# Patient Record
Sex: Female | Born: 1965 | Race: Black or African American | Hispanic: No | Marital: Married | Smoking: Former smoker
Health system: Southern US, Community
[De-identification: ages and names within clinical notes are randomized; demographics above are authoritative.]

## PROBLEM LIST (undated history)

## (undated) HISTORY — PX: HERNIA REPAIR: SHX51

## (undated) HISTORY — PX: TUBAL LIGATION: SHX77

## (undated) HISTORY — PX: ROTATOR CUFF REPAIR: SHX139

## (undated) HISTORY — PX: LAPAROSCOPIC GASTRIC SLEEVE RESECTION: SHX5895

---

## 2003-10-06 HISTORY — PX: LAPAROSCOPIC GASTRIC BANDING: SHX1100

## 2011-12-23 ENCOUNTER — Emergency Department (HOSPITAL_BASED_OUTPATIENT_CLINIC_OR_DEPARTMENT_OTHER)
Admission: EM | Admit: 2011-12-23 | Discharge: 2011-12-23 | Disposition: A | Discharge status: Home / Self Care | Payer: No Typology Code available for payment source | Attending: Emergency Medicine | Admitting: Emergency Medicine

## 2011-12-23 ENCOUNTER — Encounter (HOSPITAL_BASED_OUTPATIENT_CLINIC_OR_DEPARTMENT_OTHER): Payer: Self-pay | Admitting: *Deleted

## 2011-12-23 ENCOUNTER — Emergency Department (INDEPENDENT_AMBULATORY_CARE_PROVIDER_SITE_OTHER): Payer: No Typology Code available for payment source

## 2011-12-23 DIAGNOSIS — J45909 Unspecified asthma, uncomplicated: Secondary | ICD-10-CM

## 2011-12-23 DIAGNOSIS — R062 Wheezing: Secondary | ICD-10-CM

## 2011-12-23 DIAGNOSIS — R0602 Shortness of breath: Secondary | ICD-10-CM | POA: Insufficient documentation

## 2011-12-23 DIAGNOSIS — R059 Cough, unspecified: Secondary | ICD-10-CM

## 2011-12-23 DIAGNOSIS — R0989 Other specified symptoms and signs involving the circulatory and respiratory systems: Secondary | ICD-10-CM

## 2011-12-23 DIAGNOSIS — R05 Cough: Secondary | ICD-10-CM

## 2011-12-23 MED ORDER — ALBUTEROL SULFATE (5 MG/ML) 0.5% IN NEBU
INHALATION_SOLUTION | RESPIRATORY_TRACT | Status: AC
  Administered 2011-12-23: 5 mg via RESPIRATORY_TRACT
  Filled 2011-12-23: qty 1

## 2011-12-23 MED ORDER — IPRATROPIUM BROMIDE 0.02 % IN SOLN
RESPIRATORY_TRACT | Status: AC
  Filled 2011-12-23: qty 2.5

## 2011-12-23 MED ORDER — PREDNISONE 20 MG PO TABS
ORAL_TABLET | ORAL | Status: AC
  Administered 2011-12-23: 20 mg via ORAL
  Filled 2011-12-23: qty 1

## 2011-12-23 MED ORDER — IPRATROPIUM BROMIDE 0.02 % IN SOLN
0.5000 mg | Freq: Once | RESPIRATORY_TRACT | Status: AC
  Administered 2011-12-23: 0.5 mg via RESPIRATORY_TRACT

## 2011-12-23 MED ORDER — PREDNISONE 20 MG PO TABS
20.0000 mg | ORAL_TABLET | Freq: Every day | ORAL | Status: DC
  Administered 2011-12-23: 20 mg via ORAL

## 2011-12-23 MED ORDER — ALBUTEROL SULFATE (5 MG/ML) 0.5% IN NEBU
5.0000 mg | INHALATION_SOLUTION | Freq: Once | RESPIRATORY_TRACT | Status: AC
  Administered 2011-12-23: 5 mg via RESPIRATORY_TRACT

## 2011-12-23 MED ORDER — ALBUTEROL SULFATE (5 MG/ML) 0.5% IN NEBU
INHALATION_SOLUTION | RESPIRATORY_TRACT | Status: AC
  Filled 2011-12-23: qty 1

## 2011-12-23 MED ORDER — ALBUTEROL SULFATE (5 MG/ML) 0.5% IN NEBU
2.5000 mg | INHALATION_SOLUTION | Freq: Once | RESPIRATORY_TRACT | Status: AC
  Administered 2011-12-23: 5 mg via RESPIRATORY_TRACT

## 2011-12-23 MED ORDER — FLUTICASONE-SALMETEROL 250-50 MCG/DOSE IN AEPB: 1.0000 | INHALATION_SPRAY | Freq: Two times a day (BID) | RESPIRATORY_TRACT | Status: DC

## 2011-12-23 MED ORDER — PREDNISONE 20 MG PO TABS: 40.0000 mg | ORAL_TABLET | Freq: Every day | ORAL | Status: AC

## 2011-12-23 NOTE — Discharge Instructions (Signed)
Asthma, Adult  Asthma is a disease of the lungs and can make it hard to breathe. Asthma cannot be cured, but medicine can help control it. Asthma may be started (triggered) by:   Pollen.   Dust.   Animal skin flakes (dander).   Molds.   Foods.   Respiratory infections (colds, flu).   Smoke.   Exercise.   Stress.   Other things that cause allergic reactions or allergies (allergens).  HOME CARE    Talk to your doctor about how to manage your attacks at home. This may include:   Using a tool called a peak flow meter.   Having medicine ready to stop the attack.   Take all medicine as told by your doctor.   Wash bed sheets and blankets every week in hot water and put them in the dryer.   Drink enough fluids to keep your pee (urine) clear or pale yellow.   Always be ready to get emergency help. Write down the phone number for your doctor. Keep it where you can easily find it.   Talk about exercise routines with your doctor.   If animal dander is causing your asthma, you may need to find a new home for your pet(s).  GET HELP RIGHT AWAY IF:    You have muscle aches.   You cough more.   You have chest pain.   You have thick spit (sputum) that changes to yellow, green, gray, or bloody.   Medicine does not stop your wheezing.   You have problems breathing.   You have a fever.   Your medicine causes:   A rash.   Itching.   Puffiness (swelling).   Breathing problems.  MAKE SURE YOU:    Understand these instructions.   Will watch your condition.   Will get help right away if you are not doing well or get worse.  Document Released: 03/09/2008 Document Revised: 09/10/2011 Document Reviewed: 08/01/2008  [employer] Patient Information 2012 [employer].

## 2011-12-23 NOTE — ED Notes (Signed)
Pt in with c/o SOB with inspiratory and expiratory wheezing off and on since . Pt currently out of inhalers and asthma meds. respiratory at triage for assessment.

## 2011-12-23 NOTE — ED Provider Notes (Addendum)
History     CSN: [id number]  Arrival date & time 12/23/11  1608   First MD Initiated Contact with Patient 12/23/11 1625      Chief Complaint  Patient presents with  . Asthma    (Consider location/radiation/quality/duration/timing/severity/associated sxs/prior treatment) Patient is a 46 y.o. female presenting with asthma. The history is provided by the patient.  Asthma This is a new problem. Episode onset: 2 weeks. The problem occurs constantly. The problem has been gradually worsening. Associated symptoms include shortness of breath. Pertinent negatives include no chest pain and no abdominal pain. The symptoms are aggravated by walking. Relieved by: albuterol nebs. Treatments tried: nebs. The treatment provided mild relief.    No past medical history on file.  No past surgical history on file.  No family history on file.  History  Substance Use Topics  . Smoking status: Not on file  . Smokeless tobacco: Not on file  . Alcohol Use: Not on file    OB History    No data available      Review of Systems  Constitutional: Negative for fever.  HENT: Positive for congestion and rhinorrhea.   Respiratory: Positive for cough and shortness of breath.   Cardiovascular: Negative for chest pain.  Gastrointestinal: Negative for abdominal pain.  All other systems reviewed and are negative.    Allergies  Vicodin  Home Medications  No current outpatient prescriptions on file.  BP 137/107  Pulse 97  Temp(Src) 98.2 F (36.8 C) (Oral)  Resp 20  Ht 5\' 6"  (1.676 m)  Wt 340 lb (154.223 kg)  BMI 54.88 kg/m2  SpO2 96%  Physical Exam  Constitutional: She is oriented to person, place, and time. She appears well-developed and well-nourished. No distress.  HENT:  Head: Normocephalic and atraumatic.  Right Ear: Tympanic membrane, external ear and ear canal normal.  Left Ear: Tympanic membrane, external ear and ear canal normal.  Nose: Mucosal edema present.  Mouth/Throat:  Oropharynx is clear and moist.  Eyes: Conjunctivae and EOM are normal. Pupils are equal, round, and reactive to light. Right eye exhibits no discharge.  Neck: Normal range of motion. Neck supple.  Cardiovascular: Normal rate, regular rhythm, normal heart sounds and intact distal pulses.   No murmur heard. Pulmonary/Chest: Tachypnea noted. No respiratory distress. She has no decreased breath sounds. She has wheezes. She has no rhonchi. She has no rales.  Abdominal: Soft. There is no tenderness.  Musculoskeletal: Normal range of motion. She exhibits no edema and no tenderness.  Neurological: She is alert and oriented to person, place, and time.  Skin: Skin is warm and dry. No rash noted.  Psychiatric: She has a normal mood and affect.    ED Course  Procedures (including critical care time)  Labs Reviewed - No data to display Dg Chest 2 View  12/23/2011  *RADIOLOGY REPORT*  Clinical Data: Cough, congestion, asthma.  CHEST - 2 VIEW  Comparison: None.  Findings: Trachea is midline.  Heart size normal.  Lungs are somewhat low in volume but clear.  No pleural fluid.  IMPRESSION: No acute findings.  Original Report Authenticated By: Reyes Ivan, M.D.     1. Asthma       MDM   Pt with typical asthma exacerbation  Symptoms out of advair for 3 weeks.  Recent cold which she states is improving but persistent productive cough and denies other complaints.  Wheezing on exam.  will give steroids, albuterol/atrovent and recheck.  CXR pending.   5:07  PM CXR wnl.  Wheezing improved and will d/c home.      Gwyneth Sprout, MD 12/23/11 1707  Gwyneth Sprout, MD 12/23/11 9811  Gwyneth Sprout, MD 12/23/11 1721

## 2012-03-13 ENCOUNTER — Encounter (HOSPITAL_BASED_OUTPATIENT_CLINIC_OR_DEPARTMENT_OTHER): Payer: Self-pay | Admitting: *Deleted

## 2012-03-13 ENCOUNTER — Emergency Department (HOSPITAL_BASED_OUTPATIENT_CLINIC_OR_DEPARTMENT_OTHER)
Admission: EM | Admit: 2012-03-13 | Discharge: 2012-03-14 | Disposition: A | Discharge status: Home / Self Care | Payer: No Typology Code available for payment source | Attending: Emergency Medicine | Admitting: Emergency Medicine

## 2012-03-13 DIAGNOSIS — F410 Panic disorder [episodic paroxysmal anxiety] without agoraphobia: Secondary | ICD-10-CM | POA: Insufficient documentation

## 2012-03-13 DIAGNOSIS — R000 Tachycardia, unspecified: Secondary | ICD-10-CM | POA: Insufficient documentation

## 2012-03-13 MED ORDER — SODIUM CHLORIDE 0.9 % IV BOLUS (SEPSIS)
1000.0000 mL | Freq: Once | INTRAVENOUS | Status: AC
  Administered 2012-03-14: 1000 mL via INTRAVENOUS

## 2012-03-13 NOTE — ED Notes (Signed)
Pt reports that she has been under a lot of stress and has not eaten well lately
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
## 2012-03-13 NOTE — ED Notes (Signed)
Patient moved from EMS stretcher to ED bed and medics noticed patient was sitting in her own blood. Patient thinks all may be from menstral bleeding. Patient stated no chance of her being pregnant.

## 2012-03-14 LAB — COMPREHENSIVE METABOLIC PANEL
ALT: 21 U/L (ref 0–35)
AST: 51 U/L — ABNORMAL HIGH (ref 0–37)
Alkaline Phosphatase: 90 U/L (ref 39–117)
CO2: 24 mEq/L (ref 19–32)
Chloride: 101 mEq/L (ref 96–112)
Creatinine, Ser: 1 mg/dL (ref 0.50–1.10)
GFR calc non Af Amer: 67 mL/min — ABNORMAL LOW (ref >90)
Total Bilirubin: 0.5 mg/dL (ref 0.3–1.2)

## 2012-03-14 LAB — DIFFERENTIAL
Basophils Absolute: 0 10*3/uL (ref 0.0–0.1)
Basophils Relative: 0 % (ref 0–1)
Eosinophils Absolute: 0.4 10*3/uL (ref 0.0–0.7)
Neutrophils Relative %: 76 % (ref 43–77)

## 2012-03-14 LAB — CBC
MCH: 25.6 pg — ABNORMAL LOW (ref 26.0–34.0)
MCHC: 36.4 g/dL — ABNORMAL HIGH (ref 30.0–36.0)
Platelets: 390 10*3/uL (ref 150–400)
RBC: 4.45 MIL/uL (ref 3.87–5.11)
RDW: 16.7 % — ABNORMAL HIGH (ref 11.5–15.5)

## 2012-03-14 LAB — PREGNANCY, URINE: Preg Test, Ur: NEGATIVE

## 2012-03-14 LAB — RAPID URINE DRUG SCREEN, HOSP PERFORMED: Opiates: NOT DETECTED

## 2012-03-14 LAB — ETHANOL: Alcohol, Ethyl (B): <11 mg/dL (ref 0–11)

## 2012-03-14 MED ORDER — DIAZEPAM 5 MG PO TABS
10.0000 mg | ORAL_TABLET | Freq: Once | ORAL | Status: AC
  Administered 2012-03-14: 10 mg via ORAL
  Filled 2012-03-14: qty 2

## 2012-03-14 MED ORDER — DIAZEPAM 5 MG PO TABS: 5.0000 mg | ORAL_TABLET | Freq: Three times a day (TID) | ORAL | Status: AC | PRN

## 2012-03-14 NOTE — ED Provider Notes (Addendum)
History     CSN: [id number]  Arrival date & time 03/13/12  2257   First MD Initiated Contact with Patient 03/14/12 0101      Chief Complaint  Patient presents with  . Panic Attack    (Consider location/radiation/quality/duration/timing/severity/associated sxs/prior treatment) HPI This is a 46 year old black female with a remote history of panic attacks. She states she has not been sleeping well for about a week. She spent the last 3 days throwing a graduation party for her daughter. This party included about 100 guests. She states that she has only gotten about 2 hours of sleep a night. She has been up since early yesterday morning. She states that she had a panic attack late yesterday evening. It was characterized by hyperventilation, paresthesias of the fingertips, weakness and near-syncope. She did not completely lose consciousness. EMS was called and brought her here. She was noted to be tachycardic prior to arrival and on arrival. Her symptoms have improved since arrival. She denies chest pain at any time. She has not been vomiting. She is on her period and having heavy menstrual bleeding. She has also been having muscle spasms, particularly in her legs which he describes as "charley horses".  Past Medical History  Diagnosis Date  . Asthma     Past Surgical History  Procedure Date  . Rotator cuff repair     R arm  . Hernia repair   . Tubal ligation     No family history on file.  History  Substance Use Topics  . Smoking status: Former Games developer  . Smokeless tobacco: Never Used  . Alcohol Use: 0.6 oz/week    1 Cans of beer per week    OB History    Grav Para Term Preterm Abortions TAB SAB Ect Mult Living                  Review of Systems  All other systems reviewed and are negative.    Allergies  Vicodin  Home Medications   Current Outpatient Rx  Name Route Sig Dispense Refill  . ALBUTEROL SULFATE (5 MG/ML) 0.5% IN NEBU Nebulization Take 2.5 mg by  nebulization every 6 (six) hours as needed.    Marland Kitchen FLUTICASONE-SALMETEROL 250-50 MCG/DOSE IN AEPB Inhalation Inhale 1 puff into the lungs 2 (two) times daily. 60 each 2  . IBUPROFEN 200 MG PO TABS Oral Take 400 mg by mouth every 6 (six) hours as needed. Patient used this medication for her cramps.    Marland Kitchen MONTELUKAST SODIUM 10 MG PO TABS Oral Take 10 mg by mouth at bedtime.    Marland Kitchen BENADRYL ALLERGY PO Oral Take 2 capsules by mouth daily as needed. Patient used this medication for allergy symptoms.      BP 156/97  Pulse 102  Temp(Src) 98.9 F (37.2 C) (Oral)  Resp 22  SpO2 100%  LMP 03/13/2012  Physical Exam General: Well-developed, obese female in no acute distress; appearance consistent with age of record HENT: normocephalic, atraumatic Eyes: pupils equal round and reactive to light; extraocular muscles intact Neck: supple Heart: regular rate and rhythm; tachycardic Lungs: clear to auscultation bilaterally Abdomen: soft; nondistended; obese Extremities: No deformity; full range of motion; pulses normal Neurologic: Awake, alert and oriented; motor function intact in all extremities and symmetric; no facial droop Skin: Warm and dry Psychiatric: Normal mood and affect    ED Course  Procedures (including critical care time)     MDM   Nursing notes and vitals signs, including pulse  oximetry, reviewed.  Summary of this visit's results, reviewed by myself:  Labs:  Results for orders placed during the hospital encounter of 03/13/12  COMPREHENSIVE METABOLIC PANEL      Component Value Range   Sodium 136  135 - 145 (mEq/L)   Potassium 3.2 (*) 3.5 - 5.1 (mEq/L)   Chloride 101  96 - 112 (mEq/L)   CO2 24  19 - 32 (mEq/L)   Glucose, Bld 114 (*) 70 - 99 (mg/dL)   BUN 11  6 - 23 (mg/dL)   Creatinine, Ser 4.09  0.50 - 1.10 (mg/dL)   Calcium 9.4  8.4 - 81.1 (mg/dL)   Total Protein 8.1  6.0 - 8.3 (g/dL)   Albumin 4.0  3.5 - 5.2 (g/dL)   AST 51 (*) 0 - 37 (U/L)   ALT 21  0 - 35 (U/L)    Alkaline Phosphatase 90  39 - 117 (U/L)   Total Bilirubin 0.5  0.3 - 1.2 (mg/dL)   GFR calc non Af Amer 67 (*) >90 (mL/min)   GFR calc Af Amer 78 (*) >90 (mL/min)  CBC      Component Value Range   WBC 12.9 (*) 4.0 - 10.5 (K/uL)   RBC 4.45  3.87 - 5.11 (MIL/uL)   Hemoglobin 11.4 (*) 12.0 - 15.0 (g/dL)   HCT 91.4 (*) 78.2 - 46.0 (%)   MCV 70.3 (*) 78.0 - 100.0 (fL)   MCH 25.6 (*) 26.0 - 34.0 (pg)   MCHC 36.4 (*) 30.0 - 36.0 (g/dL)   RDW 95.6 (*) 21.3 - 15.5 (%)   Platelets 390  150 - 400 (K/uL)  DIFFERENTIAL      Component Value Range   Neutrophils Relative 76  43 - 77 (%)   Neutro Abs 9.8 (*) 1.7 - 7.7 (K/uL)   Lymphocytes Relative 13  12 - 46 (%)   Lymphs Abs 1.7  0.7 - 4.0 (K/uL)   Monocytes Relative 8  3 - 12 (%)   Monocytes Absolute 1.0  0.1 - 1.0 (K/uL)   Eosinophils Relative 3  0 - 5 (%)   Eosinophils Absolute 0.4  0.0 - 0.7 (K/uL)   Basophils Relative 0  0 - 1 (%)   Basophils Absolute 0.0  0.0 - 0.1 (K/uL)  ETHANOL      Component Value Range   Alcohol, Ethyl (B) <11  0 - 11 (mg/dL)  URINE RAPID DRUG SCREEN (HOSP PERFORMED)      Component Value Range   Opiates NONE DETECTED  NONE DETECTED    Cocaine NONE DETECTED  NONE DETECTED    Benzodiazepines NONE DETECTED  NONE DETECTED    Amphetamines NONE DETECTED  NONE DETECTED    Tetrahydrocannabinol NONE DETECTED  NONE DETECTED    Barbiturates NONE DETECTED  NONE DETECTED   PREGNANCY, URINE      Component Value Range   Preg Test, Ur NEGATIVE  NEGATIVE     Imaging Studies: No results found.  EKG Interpretation:  Date & Time: 03/14/2012 11:32 PM  Rate: 98  Rhythm: normal sinus rhythm  QRS Axis: normal  Intervals: normal  ST/T Wave abnormalities: normal  Conduction Disutrbances:none  Narrative Interpretation:   Old EKG Reviewed: none available  2:16 AM Patient feels better after 1 L of normal saline bolus.           Hanley Seamen, MD 03/14/12 0214  Hanley Seamen, MD 03/14/12 0865

## 2012-03-14 NOTE — Discharge Instructions (Signed)

## 2012-09-04 HISTORY — PX: LAPAROSCOPIC REPAIR AND REMOVAL OF GASTRIC BAND: SHX5919

## 2013-01-17 ENCOUNTER — Emergency Department (HOSPITAL_BASED_OUTPATIENT_CLINIC_OR_DEPARTMENT_OTHER)
Admission: EM | Admit: 2013-01-17 | Discharge: 2013-01-17 | Disposition: A | Discharge status: Home / Self Care | Payer: No Typology Code available for payment source | Attending: Emergency Medicine | Admitting: Emergency Medicine

## 2013-01-17 ENCOUNTER — Encounter (HOSPITAL_BASED_OUTPATIENT_CLINIC_OR_DEPARTMENT_OTHER): Payer: Self-pay | Admitting: *Deleted

## 2013-01-17 ENCOUNTER — Emergency Department (HOSPITAL_BASED_OUTPATIENT_CLINIC_OR_DEPARTMENT_OTHER): Payer: No Typology Code available for payment source

## 2013-01-17 DIAGNOSIS — R0789 Other chest pain: Secondary | ICD-10-CM | POA: Insufficient documentation

## 2013-01-17 DIAGNOSIS — R05 Cough: Secondary | ICD-10-CM | POA: Insufficient documentation

## 2013-01-17 DIAGNOSIS — J45901 Unspecified asthma with (acute) exacerbation: Secondary | ICD-10-CM | POA: Insufficient documentation

## 2013-01-17 DIAGNOSIS — Z87891 Personal history of nicotine dependence: Secondary | ICD-10-CM | POA: Insufficient documentation

## 2013-01-17 DIAGNOSIS — R509 Fever, unspecified: Secondary | ICD-10-CM | POA: Insufficient documentation

## 2013-01-17 DIAGNOSIS — R059 Cough, unspecified: Secondary | ICD-10-CM | POA: Insufficient documentation

## 2013-01-17 DIAGNOSIS — Z79899 Other long term (current) drug therapy: Secondary | ICD-10-CM | POA: Insufficient documentation

## 2013-01-17 MED ORDER — ALBUTEROL SULFATE (5 MG/ML) 0.5% IN NEBU
5.0000 mg | INHALATION_SOLUTION | Freq: Once | RESPIRATORY_TRACT | Status: AC
  Administered 2013-01-17: 5 mg via RESPIRATORY_TRACT

## 2013-01-17 MED ORDER — PREDNISONE 50 MG PO TABS
60.0000 mg | ORAL_TABLET | Freq: Once | ORAL | Status: AC
  Administered 2013-01-17: 60 mg via ORAL
  Filled 2013-01-17: qty 1

## 2013-01-17 MED ORDER — ALBUTEROL SULFATE (5 MG/ML) 0.5% IN NEBU
INHALATION_SOLUTION | RESPIRATORY_TRACT | Status: AC
  Administered 2013-01-17: 5 mg via RESPIRATORY_TRACT
  Filled 2013-01-17: qty 1

## 2013-01-17 MED ORDER — IPRATROPIUM BROMIDE 0.02 % IN SOLN
0.5000 mg | Freq: Once | RESPIRATORY_TRACT | Status: AC
  Administered 2013-01-17: 0.5 mg via RESPIRATORY_TRACT
  Filled 2013-01-17: qty 2.5

## 2013-01-17 MED ORDER — ALBUTEROL (5 MG/ML) CONTINUOUS INHALATION SOLN
10.0000 mg/h | INHALATION_SOLUTION | Freq: Once | RESPIRATORY_TRACT | Status: DC
  Filled 2013-01-17: qty 0.5
  Filled 2013-01-17: qty 20

## 2013-01-17 MED ORDER — BENZONATATE 100 MG PO CAPS: 100.0000 mg | ORAL_CAPSULE | Freq: Three times a day (TID) | ORAL | Status: DC | PRN

## 2013-01-17 MED ORDER — ALBUTEROL (5 MG/ML) CONTINUOUS INHALATION SOLN
15.0000 mg/h | INHALATION_SOLUTION | Freq: Once | RESPIRATORY_TRACT | Status: AC
  Administered 2013-01-17: 15 mg/h via RESPIRATORY_TRACT
  Filled 2013-01-17: qty 20

## 2013-01-17 MED ORDER — ALBUTEROL SULFATE (5 MG/ML) 0.5% IN NEBU
INHALATION_SOLUTION | RESPIRATORY_TRACT | Status: AC
  Filled 2013-01-17: qty 1

## 2013-01-17 MED ORDER — PREDNISONE 50 MG PO TABS: ORAL_TABLET | ORAL | Status: DC

## 2013-01-17 NOTE — ED Notes (Signed)
Cold symptoms with upper respiratory and cough for one week.  This morning at 0830 she developed a sudden onset of sob.  Uncontrollable cough with audible wheezes noted.

## 2013-01-17 NOTE — ED Provider Notes (Signed)
I saw and evaluated the patient, reviewed the resident's note and I agree with the findings and plan.  Hx asthma with one week of difficulty breathing, coughing, chest tightness with coughing.  ED visit one year ago, no problems since then.  States compliance with meds but conflicting history given to different staff members. Diffuse expiratory wheezing with decreased air  Improved after steroids and continuous neb.  Glynn Octave, MD 01/17/13 [phone]

## 2013-01-17 NOTE — ED Notes (Signed)
Respiratory assessment after 1 hour NEB: PEFR 380 102% predicted FEV1 1.4 45% predicted Good effort from patient

## 2013-01-17 NOTE — ED Notes (Signed)
Respiratory PEFR assessment: PEFR 315 l/m 85% of predicted. FEV1 1.4l/sec 45% of predicted. Good effort from patient.

## 2013-01-17 NOTE — ED Notes (Signed)
Patient ambulated to and from restroom HR 128-138, RR 18-24, SpO2 94-98% on room air.

## 2013-01-17 NOTE — ED Provider Notes (Signed)
History     CSN: [id number]  Arrival date & time 01/17/13  0918   First MD Initiated Contact with Patient 01/17/13 [phone]      Chief Complaint  Patient presents with  . Shortness of Breath    (Consider location/radiation/quality/duration/timing/severity/associated sxs/prior treatment) Patient is a 47 y.o. female presenting with shortness of breath.  Shortness of Breath Associated symptoms: fever   Associated symptoms: no abdominal pain and no rash     Patient presents with wheezing and uncontrollable coughing.  She has a history of Asthma, says she has been "nursing a cold" for a week and using her albuterol inhaler/nebs around the clock.  This morning she took an albuterol treatment about 6 am and it did not help, she had uncontrollable coughing, so she decided to come to the ER.  She says she had a fever on Friday (4 days ago), but not since then.  She coughed up some phlegm this morning. She says she has some chest tightness that is typical for her asthma exacerbations.   She is not taking any other medications and has no other medical problems.   Past Medical History  Diagnosis Date  . Asthma     Past Surgical History  Procedure Laterality Date  . Rotator cuff repair      R arm  . Hernia repair    . Tubal ligation    . Laparoscopic gastric banding  2005  . Laparoscopic repair and removal of gasric band  09/2012    No family history on file.  History  Substance Use Topics  . Smoking status: Former Games developer  . Smokeless tobacco: Never Used  . Alcohol Use: 0.6 oz/week    1 Cans of beer per week   Review of Systems  Constitutional: Positive for fever.  Eyes: Negative for visual disturbance.  Respiratory: Positive for shortness of breath.   Cardiovascular: Negative for leg swelling.  Gastrointestinal: Negative for abdominal pain.  Musculoskeletal: Negative for myalgias.  Skin: Negative for rash.  Neurological: Negative for dizziness.  Hematological: Negative for  adenopathy.    Allergies  Vicodin  Home Medications   Current Outpatient Rx  Name  Route  Sig  Dispense  Refill  . albuterol (PROVENTIL) (5 MG/ML) 0.5% nebulizer solution   Nebulization   Take 2.5 mg by nebulization every 6 (six) hours as needed.         . DiphenhydrAMINE HCl (BENADRYL ALLERGY PO)   Oral   Take 2 capsules by mouth daily as needed. Patient used this medication for allergy symptoms.         . Fluticasone-Salmeterol (ADVAIR DISKUS) 250-50 MCG/DOSE AEPB   Inhalation   Inhale 1 puff into the lungs 2 (two) times daily.   60 each   2   . ibuprofen (ADVIL,MOTRIN) 200 MG tablet   Oral   Take 400 mg by mouth every 6 (six) hours as needed. Patient used this medication for her cramps.         . montelukast (SINGULAIR) 10 MG tablet   Oral   Take 10 mg by mouth at bedtime.           BP 154/99  Pulse 136  Resp 28  SpO2 93%  LMP 01/14/2013  Physical Exam  Vitals reviewed. Constitutional: She appears well-developed and well-nourished. She appears distressed.  HENT:  Head: Normocephalic.  Right Ear: External ear normal.  Left Ear: External ear normal.  Mouth/Throat: Oropharynx is clear and moist.  Eyes: EOM are normal.  Cardiovascular: Regular rhythm.  Tachycardia present.   Pulmonary/Chest:  Tachypnea, difficulty speaking in complete sentences, poor air movement with diffuse wheezes  Musculoskeletal: She exhibits no edema.  No calf tenderness.  Lymphadenopathy:    She has no cervical adenopathy.  Skin: Skin is warm. No rash noted.    ED Course  Procedures (including critical care time)  Labs Reviewed - No data to display Dg Chest 2 View  01/17/2013  *RADIOLOGY REPORT*  Clinical Data: Shortness of breath with cough and wheezing. History of asthma.  CHEST - 2 VIEW  Comparison: 12/23/2011.  Findings: The heart size and mediastinal contours are stable. There are stable low lung volumes with mild bibasilar atelectasis. No edema, confluent airspace  opacity or significant pleural effusion is seen.  IMPRESSION: Stable examination.  No acute cardiopulmonary process.   Original Report Authenticated By: Carey Bullocks, M.D.    CRITICAL CARE Performed by: Ardyth Gal   Total critical care time: 1 hour  Critical care time was exclusive of separately billable procedures and treating other patients.  Critical care was necessary to treat or prevent imminent or life-threatening deterioration.  Critical care was time spent personally by me on the following activities: development of treatment plan with patient and/or surrogate as well as nursing, discussions with consultants, evaluation of patient's response to treatment, examination of patient, obtaining history from patient or surrogate, ordering and performing treatments and interventions, ordering and review of laboratory studies, ordering and review of radiographic studies, pulse oximetry and re-evaluation of patient's condition.   1. Acute asthma exacerbation       MDM  After continuous albuterol nebulizer x 1 hour and PO Prednisone patient with significant symptomatic improvement in breathing as well as decreased wheezing on clinical exam. She was able to ambulate without desaturations.  Discharged home with course of prednisone and tessalon for cough Advised she establish a primary care provider in the area for Asthma management.         Ardyth Gal, MD 01/17/13 [phone]

## 2015-01-08 ENCOUNTER — Emergency Department (HOSPITAL_BASED_OUTPATIENT_CLINIC_OR_DEPARTMENT_OTHER): Payer: No Typology Code available for payment source

## 2015-01-08 ENCOUNTER — Encounter (HOSPITAL_BASED_OUTPATIENT_CLINIC_OR_DEPARTMENT_OTHER): Payer: Self-pay

## 2015-01-08 ENCOUNTER — Emergency Department (HOSPITAL_BASED_OUTPATIENT_CLINIC_OR_DEPARTMENT_OTHER)
Admission: EM | Admit: 2015-01-08 | Discharge: 2015-01-08 | Disposition: A | Discharge status: Home / Self Care | Payer: No Typology Code available for payment source | Attending: Emergency Medicine | Admitting: Emergency Medicine

## 2015-01-08 DIAGNOSIS — Y998 Other external cause status: Secondary | ICD-10-CM | POA: Diagnosis not present

## 2015-01-08 DIAGNOSIS — Z87891 Personal history of nicotine dependence: Secondary | ICD-10-CM | POA: Diagnosis not present

## 2015-01-08 DIAGNOSIS — Y9389 Activity, other specified: Secondary | ICD-10-CM | POA: Insufficient documentation

## 2015-01-08 DIAGNOSIS — W1849XA Other slipping, tripping and stumbling without falling, initial encounter: Secondary | ICD-10-CM | POA: Diagnosis not present

## 2015-01-08 DIAGNOSIS — Z79899 Other long term (current) drug therapy: Secondary | ICD-10-CM | POA: Diagnosis not present

## 2015-01-08 DIAGNOSIS — Y9289 Other specified places as the place of occurrence of the external cause: Secondary | ICD-10-CM | POA: Insufficient documentation

## 2015-01-08 DIAGNOSIS — S93601A Unspecified sprain of right foot, initial encounter: Secondary | ICD-10-CM | POA: Insufficient documentation

## 2015-01-08 DIAGNOSIS — S99921A Unspecified injury of right foot, initial encounter: Secondary | ICD-10-CM | POA: Diagnosis present

## 2015-01-08 DIAGNOSIS — J45909 Unspecified asthma, uncomplicated: Secondary | ICD-10-CM | POA: Insufficient documentation

## 2015-01-08 MED ORDER — METHOCARBAMOL 500 MG PO TABS: 500.0000 mg | ORAL_TABLET | Freq: Two times a day (BID) | ORAL | Status: DC

## 2015-01-08 MED ORDER — IBUPROFEN 800 MG PO TABS: 800.0000 mg | ORAL_TABLET | Freq: Three times a day (TID) | ORAL | Status: DC

## 2015-01-08 NOTE — ED Notes (Signed)
C/o right foot pain x 1 month-no known injury-pain started after being on a cruise

## 2015-01-08 NOTE — Discharge Instructions (Signed)
Foot Sprain The muscles and cord like structures which attach muscle to bone (tendons) that surround the feet are made up of units. A foot sprain can occur at the weakest spot in any of these units. This condition is most often caused by injury to or overuse of the foot, as from playing contact sports, or aggravating a previous injury, or from poor conditioning, or obesity. SYMPTOMS  Pain with movement of the foot.  Tenderness and swelling at the injury site.  Loss of strength is present in moderate or severe sprains. THE THREE GRADES OR SEVERITY OF FOOT SPRAIN ARE:  Mild (Grade I): Slightly pulled muscle without tearing of muscle or tendon fibers or loss of strength.  Moderate (Grade II): Tearing of fibers in a muscle, tendon, or at the attachment to bone, with small decrease in strength.  Severe (Grade III): Rupture of the muscle-tendon-bone attachment, with separation of fibers. Severe sprain requires surgical repair. Often repeating (chronic) sprains are caused by overuse. Sudden (acute) sprains are caused by direct injury or over-use. DIAGNOSIS  Diagnosis of this condition is usually by your own observation. If problems continue, a caregiver may be required for further evaluation and treatment. X-rays may be required to make sure there are not breaks in the bones (fractures) present. Continued problems may require physical therapy for treatment. PREVENTION  Use strength and conditioning exercises appropriate for your sport.  Warm up properly prior to working out.  Use athletic shoes that are made for the sport you are participating in.  Allow adequate time for healing. Early return to activities makes repeat injury more likely, and can lead to an unstable arthritic foot that can result in prolonged disability. Mild sprains generally heal in 3 to 10 days, with moderate and severe sprains taking 2 to 10 weeks. Your caregiver can help you determine the proper time required for  healing. HOME CARE INSTRUCTIONS   Apply ice to the injury for 15-20 minutes, 03-04 times per day. Put the ice in a plastic bag and place a towel between the bag of ice and your skin.  An elastic wrap (like an Ace bandage) may be used to keep swelling down.  Keep foot above the level of the heart, or at least raised on a footstool, when swelling and pain are present.  Try to avoid use other than gentle range of motion while the foot is painful. Do not resume use until instructed by your caregiver. Then begin use gradually, not increasing use to the point of pain. If pain does develop, decrease use and continue the above measures, gradually increasing activities that do not cause discomfort, until you gradually achieve normal use.  Use crutches if and as instructed, and for the length of time instructed.  Keep injured foot and ankle wrapped between treatments.  Massage foot and ankle for comfort and to keep swelling down. Massage from the toes up towards the knee.  Only take over-the-counter or prescription medicines for pain, discomfort, or fever as directed by your caregiver. SEEK IMMEDIATE MEDICAL CARE IF:   Your pain and swelling increase, or pain is not controlled with medications.  You have loss of feeling in your foot or your foot turns cold or blue.  You develop new, unexplained symptoms, or an increase of the symptoms that brought you to your caregiver. MAKE SURE YOU:   Understand these instructions.  Will watch your condition.  Will get help right away if you are not doing well or get worse. Document Released:   03/13/2002 Document Revised: 12/14/2011 Document Reviewed: 05/10/2008 [employer] Patient Information 2015 [employer]. This information is not intended to replace advice given to you by your health care provider. Make sure you discuss any questions you have with your health care provider.  

## 2015-01-08 NOTE — ED Provider Notes (Signed)
CSN: [id number]     Arrival date & time 01/08/15  1134 History   First MD Initiated Contact with Patient 01/08/15 1201     Chief Complaint  Patient presents with  . Foot Pain     (Consider location/radiation/quality/duration/timing/severity/associated sxs/prior Treatment) HPI   49 year old obese female presents complaining of right foot pain. Patient reports she may have injured her right foot when she stepped awkwardly while on a cruise approximately a month ago. Reports pain to the mid foot worsening with movement. She was able to ambulate afterward. Pain has gotten better however pain is returned since yesterday after prolonged walking. She described pain as a throbbing sensation, nonradiating, moderate in intensity, located to the proximal foot. She has tried taking Motrin with minimal relief. She reports some improvement when she tighten her shoes but pain returned when she is loosing her shoes.  Denies fever, chills, numbness or weakness.  No hx of gout.  No ankle, or knee pain.    Past Medical History  Diagnosis Date  . Asthma    Past Surgical History  Procedure Laterality Date  . Rotator cuff repair      R arm  . Hernia repair    . Tubal ligation    . Laparoscopic gastric banding  2005  . Laparoscopic repair and removal of gastric band  09/2012  . Laparoscopic gastric sleeve resection     No family history on file. History  Substance Use Topics  . Smoking status: Former Games developermoker  . Smokeless tobacco: Never Used  . Alcohol Use: 0.6 oz/week    1 Cans of beer per week   OB History    No data available     Review of Systems  Constitutional: Negative for fever.  Musculoskeletal: Positive for arthralgias.  Skin: Negative for rash and wound.  Neurological: Negative for numbness.      Allergies  Vicodin  Home Medications   Prior to Admission medications   Medication Sig Start Date End Date Taking? Authorizing Provider  albuterol (PROVENTIL HFA;VENTOLIN HFA) 108  (90 BASE) MCG/ACT inhaler Inhale into the lungs every 6 (six) hours as needed for wheezing or shortness of breath.   Yes Historical Provider, MD  Budesonide-Formoterol Fumarate (SYMBICORT IN) Inhale into the lungs.   Yes Historical Provider, MD  montelukast (SINGULAIR) 10 MG tablet Take 10 mg by mouth at bedtime.    Historical Provider, MD   BP 124/68 mmHg  Pulse 70  Temp(Src) 98 F (36.7 C) (Oral)  Resp 18  Ht 5\' 8"  (1.727 m)  Wt 305 lb (138.347 kg)  BMI 46.39 kg/m2  SpO2 99%  LMP 12/09/2014 Physical Exam  Constitutional: She appears well-developed and well-nourished. No distress.  HENT:  Head: Atraumatic.  Eyes: Conjunctivae are normal.  Neck: Neck supple.  Cardiovascular: Intact distal pulses.   Musculoskeletal: Normal range of motion. She exhibits tenderness (Right foot. Point tenderness noted to lateral midfoot with moderate swelling noted. No crepitus. Tenderness to fifth metatarsal. Increasing pain with foot inversion. No gross deformity. Intact distal pulse, brisk cap refill).  Normal right hip, and right knee. Normal right ankle.  Neurological: She is alert.  Skin: No rash noted.  Psychiatric: She has a normal mood and affect.  Nursing note and vitals reviewed.   ED Course  Procedures (including critical care time)  Patient presents complaining of right foot pain from a mechanical injury. She does have point tenderness noted to the proximal foot along the anterior talofibular ligament with tenderness to the  fifth metatarsal region. Suspect foot sprain versus fractures. X-ray ordered. She is neurovascularly intact. She is able to ambulate. No evidence of infection noted.  12:47 PM Xray neg.  ASO provided for support, RICE therapy discussed.  Ortho referral as needed.    Labs Review Labs Reviewed - No data to display  Imaging Review Dg Foot Complete Right  01/08/2015   CLINICAL DATA:  Right foot pain and swelling for 1 month, no known injury  EXAM: RIGHT FOOT COMPLETE  - 3+ VIEW  COMPARISON:  None.  FINDINGS: Three views of the right foot submitted. No acute fracture or subluxation. Mild dorsal spurring tarsal region. Small plantar and posterior spur of calcaneus.  IMPRESSION: No acute fracture or subluxation. Small posterior and plantar spur of calcaneus.   Electronically Signed   By: Natasha Mead M.D.   On: 01/08/2015 12:36     EKG Interpretation None      MDM   Final diagnoses:  Right foot sprain, initial encounter    BP 124/68 mmHg  Pulse 70  Temp(Src) 98 F (36.7 C) (Oral)  Resp 18  Ht  (1.727 m)  Wt 305 lb (138.347 kg)  BMI 46.39 kg/m2  SpO2 99%  LMP 12/09/2014  I have reviewed nursing notes and vital signs. I personally reviewed the imaging tests through PACS system  I reviewed available ER/hospitalization records thought the EMR     Fayrene Helper, PA-C 01/08/15 1250  Linwood Dibbles, MD 01/08/15 1252

## 2015-04-19 ENCOUNTER — Emergency Department (HOSPITAL_BASED_OUTPATIENT_CLINIC_OR_DEPARTMENT_OTHER): Payer: No Typology Code available for payment source

## 2015-04-19 ENCOUNTER — Encounter (HOSPITAL_BASED_OUTPATIENT_CLINIC_OR_DEPARTMENT_OTHER): Payer: Self-pay | Admitting: *Deleted

## 2015-04-19 ENCOUNTER — Emergency Department (HOSPITAL_BASED_OUTPATIENT_CLINIC_OR_DEPARTMENT_OTHER)
Admission: EM | Admit: 2015-04-19 | Discharge: 2015-04-19 | Disposition: A | Discharge status: Home / Self Care | Payer: No Typology Code available for payment source | Attending: Emergency Medicine | Admitting: Emergency Medicine

## 2015-04-19 DIAGNOSIS — Z87891 Personal history of nicotine dependence: Secondary | ICD-10-CM | POA: Diagnosis not present

## 2015-04-19 DIAGNOSIS — J45909 Unspecified asthma, uncomplicated: Secondary | ICD-10-CM | POA: Diagnosis not present

## 2015-04-19 DIAGNOSIS — M1712 Unilateral primary osteoarthritis, left knee: Secondary | ICD-10-CM | POA: Diagnosis not present

## 2015-04-19 DIAGNOSIS — Z79899 Other long term (current) drug therapy: Secondary | ICD-10-CM | POA: Diagnosis not present

## 2015-04-19 DIAGNOSIS — M25461 Effusion, right knee: Secondary | ICD-10-CM | POA: Diagnosis not present

## 2015-04-19 DIAGNOSIS — E669 Obesity, unspecified: Secondary | ICD-10-CM | POA: Insufficient documentation

## 2015-04-19 DIAGNOSIS — M25562 Pain in left knee: Secondary | ICD-10-CM | POA: Diagnosis present

## 2015-04-19 MED ORDER — BUDESONIDE-FORMOTEROL FUMARATE 80-4.5 MCG/ACT IN AERO: 2.0000 | INHALATION_SPRAY | Freq: Two times a day (BID) | RESPIRATORY_TRACT | Status: DC

## 2015-04-19 MED ORDER — ALBUTEROL SULFATE HFA 108 (90 BASE) MCG/ACT IN AERS: 2.0000 | INHALATION_SPRAY | Freq: Four times a day (QID) | RESPIRATORY_TRACT | Status: DC | PRN

## 2015-04-19 MED ORDER — MONTELUKAST SODIUM 10 MG PO TABS
10.0000 mg | ORAL_TABLET | Freq: Every day | ORAL | Status: DC
Start: 2015-04-19 — End: 2015-10-26

## 2015-04-19 MED ORDER — TRAMADOL HCL 50 MG PO TABS: 50.0000 mg | ORAL_TABLET | Freq: Four times a day (QID) | ORAL | Status: DC | PRN

## 2015-04-19 MED ORDER — NAPROXEN 500 MG PO TABS: 500.0000 mg | ORAL_TABLET | Freq: Two times a day (BID) | ORAL | Status: DC

## 2015-04-19 MED ORDER — KETOROLAC TROMETHAMINE 60 MG/2ML IM SOLN
60.0000 mg | Freq: Once | INTRAMUSCULAR | Status: AC
  Administered 2015-04-19: 60 mg via INTRAMUSCULAR
  Filled 2015-04-19: qty 2

## 2015-04-19 NOTE — ED Notes (Signed)
Pain in both knees for a week. Worse in the left knee.

## 2015-04-19 NOTE — Discharge Instructions (Signed)
Arthritis, Nonspecific °Arthritis is inflammation of a joint. This usually means pain, redness, warmth or swelling are present. One or more joints may be involved. There are a number of types of arthritis. Your caregiver may not be able to tell what type of arthritis you have right away. °CAUSES  °The most common cause of arthritis is the wear and tear on the joint (osteoarthritis). This causes damage to the cartilage, which can break down over time. The knees, hips, back and neck are most often affected by this type of arthritis. °Other types of arthritis and common causes of joint pain include: °· Sprains and other injuries near the joint. Sometimes minor sprains and injuries cause pain and swelling that develop hours later. °· Rheumatoid arthritis. This affects hands, feet and knees. It usually affects both sides of your body at the same time. It is often associated with chronic ailments, fever, weight loss and general weakness. °· Crystal arthritis. Gout and pseudo gout can cause occasional acute severe pain, redness and swelling in the foot, ankle, or knee. °· Infectious arthritis. Bacteria can get into a joint through a break in overlying skin. This can cause infection of the joint. Bacteria and viruses can also spread through the blood and affect your joints. °· Drug, infectious and allergy reactions. Sometimes joints can become mildly painful and slightly swollen with these types of illnesses. °SYMPTOMS  °· Pain is the main symptom. °· Your joint or joints can also be red, swollen and warm or hot to the touch. °· You may have a fever with certain types of arthritis, or even feel overall ill. °· The joint with arthritis will hurt with movement. Stiffness is present with some types of arthritis. °DIAGNOSIS  °Your caregiver will suspect arthritis based on your description of your symptoms and on your exam. Testing may be needed to find the type of arthritis: °· Blood and sometimes urine tests. °· X-ray tests  and sometimes CT or MRI scans. °· Removal of fluid from the joint (arthrocentesis) is done to check for bacteria, crystals or other causes. Your caregiver (or a specialist) will numb the area over the joint with a local anesthetic, and use a needle to remove joint fluid for examination. This procedure is only minimally uncomfortable. °· Even with these tests, your caregiver may not be able to tell what kind of arthritis you have. Consultation with a specialist (rheumatologist) may be helpful. °TREATMENT  °Your caregiver will discuss with you treatment specific to your type of arthritis. If the specific type cannot be determined, then the following general recommendations may apply. °Treatment of severe joint pain includes: °· Rest. °· Elevation. °· Anti-inflammatory medication (for example, ibuprofen) may be prescribed. Avoiding activities that cause increased pain. °· Only take over-the-counter or prescription medicines for pain and discomfort as recommended by your caregiver. °· Cold packs over an inflamed joint may be used for 10 to 15 minutes every hour. Hot packs sometimes feel better, but do not use overnight. Do not use hot packs if you are diabetic without your caregiver's permission. °· A cortisone shot into arthritic joints may help reduce pain and swelling. °· Any acute arthritis that gets worse over the next 1 to 2 days needs to be looked at to be sure there is no joint infection. °Long-term arthritis treatment involves modifying activities and lifestyle to reduce joint stress jarring. This can include weight loss. Also, exercise is needed to nourish the joint cartilage and remove waste. This helps keep the muscles   around the joint strong. °HOME CARE INSTRUCTIONS  °· Do not take aspirin to relieve pain if gout is suspected. This elevates uric acid levels. °· Only take over-the-counter or prescription medicines for pain, discomfort or fever as directed by your caregiver. °· Rest the joint as much as  possible. °· If your joint is swollen, keep it elevated. °· Use crutches if the painful joint is in your leg. °· Drinking plenty of fluids may help for certain types of arthritis. °· Follow your caregiver's dietary instructions. °· Try low-impact exercise such as: °¨ Swimming. °¨ Water aerobics. °¨ Biking. °¨ Walking. °· Morning stiffness is often relieved by a warm shower. °· Put your joints through regular range-of-motion. °SEEK MEDICAL CARE IF:  °· You do not feel better in 24 hours or are getting worse. °· You have side effects to medications, or are not getting better with treatment. °SEEK IMMEDIATE MEDICAL CARE IF:  °· You have a fever. °· You develop severe joint pain, swelling or redness. °· Many joints are involved and become painful and swollen. °· There is severe back pain and/or leg weakness. °· You have loss of bowel or bladder control. °Document Released: 10/29/2004 Document Revised: 12/14/2011 Document Reviewed: 11/14/2008 °[employer]® Patient Information ©2015 [employer]. This information is not intended to replace advice given to you by your health care provider. Make sure you discuss any questions you have with your health care provider. ° °

## 2015-04-19 NOTE — ED Provider Notes (Signed)
CSN: [id number]     Arrival date & time 04/19/15  1128 History   First MD Initiated Contact with Patient 04/19/15 1226     Chief Complaint  Patient presents with  . Knee Pain     (Consider location/radiation/quality/duration/timing/severity/associated sxs/prior Treatment) Patient is a 49 y.o. female presenting with knee pain.  Knee Pain Location:  Knee Time since incident:  2 weeks Injury: no   Knee location:  L knee Pain details:    Quality:  Sharp and shooting   Radiates to: tingling toes.   Severity:  Severe   Onset quality:  Gradual   Duration:  2 weeks   Progression:  Worsening Chronicity:  New Dislocation: no   Foreign body present:  No foreign bodies Prior injury to area:  Yes (758yr ago,  told no cartilage) Relieved by:  Nothing Worsened by:  Exercise and flexion Ineffective treatments:  NSAIDs Associated symptoms: tingling (toes, inside knee)   Associated symptoms: no back pain, no decreased ROM, no fatigue, no fever, no muscle weakness and no neck pain   Risk factors: obesity (lost 87lb)   Risk factors: no recent illness     Past Medical History  Diagnosis Date  . Asthma    Past Surgical History  Procedure Laterality Date  . Rotator cuff repair      R arm  . Hernia repair    . Tubal ligation    . Laparoscopic gastric banding  2005  . Laparoscopic repair and removal of gastric band  09/2012  . Laparoscopic gastric sleeve resection     No family history on file. History  Substance Use Topics  . Smoking status: Former Games developermoker  . Smokeless tobacco: Never Used  . Alcohol Use: 0.6 oz/week    1 Cans of beer per week   OB History    No data available     Review of Systems  Constitutional: Negative for fever and fatigue.  HENT: Negative for sore throat.   Eyes: Negative for visual disturbance.  Respiratory: Negative for cough and shortness of breath.   Cardiovascular: Negative for chest pain.  Gastrointestinal: Negative for nausea, vomiting,  abdominal pain and constipation.  Genitourinary: Negative for difficulty urinating.  Musculoskeletal: Positive for arthralgias. Negative for back pain and neck pain.  Skin: Negative for rash.  Neurological: Negative for syncope and headaches.      Allergies  Vicodin  Home Medications   Prior to Admission medications   Medication Sig Start Date End Date Taking? Authorizing Provider  albuterol (PROVENTIL HFA;VENTOLIN HFA) 108 (90 BASE) MCG/ACT inhaler Inhale 2 puffs into the lungs every 6 (six) hours as needed for wheezing or shortness of breath. 04/19/15   Alvira MondayErin Mako Pelfrey, MD  budesonide-formoterol (SYMBICORT) 80-4.5 MCG/ACT inhaler Inhale 2 puffs into the lungs 2 (two) times daily. 04/19/15   Alvira MondayErin Darneisha Windhorst, MD  methocarbamol (ROBAXIN) 500 MG tablet Take 1 tablet (500 mg total) by mouth 2 (two) times daily. 01/08/15   Fayrene HelperBowie Tran, PA-C  montelukast (SINGULAIR) 10 MG tablet Take 1 tablet (10 mg total) by mouth at bedtime. 04/19/15   Alvira MondayErin Ayane Delancey, MD  naproxen (NAPROSYN) 500 MG tablet Take 1 tablet (500 mg total) by mouth 2 (two) times daily. 04/19/15   Alvira MondayErin Franceen Erisman, MD  traMADol (ULTRAM) 50 MG tablet Take 1 tablet (50 mg total) by mouth every 6 (six) hours as needed. 04/19/15   Alvira MondayErin Alexx Giambra, MD   BP 131/81 mmHg  Pulse 57  Temp(Src) 98.2 F (36.8 C) (Oral)  Resp 20  Ht  (1.727 m)  Wt 298 lb (135.172 kg)  BMI 45.32 kg/m2  SpO2 99% Physical Exam  Constitutional: She is oriented to person, place, and time. She appears well-developed and well-nourished. No distress.  HENT:  Head: Normocephalic and atraumatic.  Eyes: Conjunctivae and EOM are normal.  Neck: Normal range of motion.  Cardiovascular: Normal rate, regular rhythm, normal heart sounds and intact distal pulses.  Exam reveals no gallop and no friction rub.   No murmur heard. Pulmonary/Chest: Effort normal and breath sounds normal. No respiratory distress. She has no wheezes. She has no rales.  Abdominal: Soft. She  exhibits no distension. There is no tenderness. There is no guarding.  Musculoskeletal: She exhibits no edema.       Right knee: She exhibits swelling (mild above right knee). She exhibits no effusion, no ecchymosis and no deformity.       Left knee: She exhibits normal range of motion, no swelling, no effusion, no ecchymosis, no laceration, normal patellar mobility and no MCL laxity. Tenderness found. Medial joint line, MCL and patellar tendon tenderness noted.  Neurological: She is alert and oriented to person, place, and time.  Skin: Skin is warm and dry. No rash noted. She is not diaphoretic. No erythema.  Nursing note and vitals reviewed.   ED Course  Procedures (including critical care time) Labs Review Labs Reviewed - No data to display  Imaging Review Dg Knee Complete 4 Views Left  04/19/2015   CLINICAL DATA:  Bilateral knee pain, left greater than right for 1 week  EXAM: LEFT KNEE - COMPLETE 4+ VIEW; RIGHT KNEE - COMPLETE 4+ VIEW  COMPARISON:  None.  FINDINGS: Mild tricompartmental degenerative changes bilaterally, most notably involving the medial compartments. No acute fracture or osteochondral abnormality. No definite joint effusions.  IMPRESSION: Degenerative changes but no acute bony findings or joint effusion.   Electronically Signed   By: Rudie Meyer M.D.   On: 04/19/2015 12:49   Dg Knee Complete 4 Views Right  04/19/2015   CLINICAL DATA:  Bilateral knee pain, left greater than right for 1 week  EXAM: LEFT KNEE - COMPLETE 4+ VIEW; RIGHT KNEE - COMPLETE 4+ VIEW  COMPARISON:  None.  FINDINGS: Mild tricompartmental degenerative changes bilaterally, most notably involving the medial compartments. No acute fracture or osteochondral abnormality. No definite joint effusions.  IMPRESSION: Degenerative changes but no acute bony findings or joint effusion.   Electronically Signed   By: Rudie Meyer M.D.   On: 04/19/2015 12:49     EKG Interpretation None      MDM   Final  diagnoses:  Primary osteoarthritis of left knee    49yo female with history of asthma, obesity presents with concern of left knee pain.  Patient without erythema, no fever, full range of motion of joint and have low suspicion for septic arthritis.  No history of trauma to suggest fracture or acute ligamentous injury.  XR of bilateral knees show bilateral degenerative changes.  DDx for pain in left knee includes exacerbation of pain related to arthritis, patello-femoral syndrome, or other meniscal abnormality. Given knee sleeve and crutches to use as needed for comfort.  Wrote rx for naproxen, tramadol and recommended calling [facility] to obtain PCP. Patient discharged in stable condition with understanding of reasons to return.     Alvira Monday, MD 04/20/15 1258

## 2015-10-26 ENCOUNTER — Encounter (HOSPITAL_BASED_OUTPATIENT_CLINIC_OR_DEPARTMENT_OTHER): Payer: Self-pay | Admitting: *Deleted

## 2015-10-26 ENCOUNTER — Emergency Department (HOSPITAL_BASED_OUTPATIENT_CLINIC_OR_DEPARTMENT_OTHER)
Admission: EM | Admit: 2015-10-26 | Discharge: 2015-10-26 | Disposition: A | Discharge status: Home / Self Care | Payer: No Typology Code available for payment source | Attending: Emergency Medicine | Admitting: Emergency Medicine

## 2015-10-26 DIAGNOSIS — S61210A Laceration without foreign body of right index finger without damage to nail, initial encounter: Secondary | ICD-10-CM | POA: Insufficient documentation

## 2015-10-26 DIAGNOSIS — Z760 Encounter for issue of repeat prescription: Secondary | ICD-10-CM | POA: Diagnosis not present

## 2015-10-26 DIAGNOSIS — Z791 Long term (current) use of non-steroidal anti-inflammatories (NSAID): Secondary | ICD-10-CM | POA: Diagnosis not present

## 2015-10-26 DIAGNOSIS — Z79899 Other long term (current) drug therapy: Secondary | ICD-10-CM | POA: Insufficient documentation

## 2015-10-26 DIAGNOSIS — Y9289 Other specified places as the place of occurrence of the external cause: Secondary | ICD-10-CM | POA: Diagnosis not present

## 2015-10-26 DIAGNOSIS — W268XXA Contact with other sharp object(s), not elsewhere classified, initial encounter: Secondary | ICD-10-CM | POA: Diagnosis not present

## 2015-10-26 DIAGNOSIS — Z23 Encounter for immunization: Secondary | ICD-10-CM | POA: Insufficient documentation

## 2015-10-26 DIAGNOSIS — Y998 Other external cause status: Secondary | ICD-10-CM | POA: Diagnosis not present

## 2015-10-26 DIAGNOSIS — Y9389 Activity, other specified: Secondary | ICD-10-CM | POA: Diagnosis not present

## 2015-10-26 DIAGNOSIS — Z87891 Personal history of nicotine dependence: Secondary | ICD-10-CM | POA: Insufficient documentation

## 2015-10-26 DIAGNOSIS — S61218A Laceration without foreign body of other finger without damage to nail, initial encounter: Secondary | ICD-10-CM

## 2015-10-26 DIAGNOSIS — J45909 Unspecified asthma, uncomplicated: Secondary | ICD-10-CM | POA: Insufficient documentation

## 2015-10-26 MED ORDER — LIDOCAINE HCL (PF) 1 % IJ SOLN
INTRAMUSCULAR | Status: AC
  Filled 2015-10-26: qty 5

## 2015-10-26 MED ORDER — TETANUS-DIPHTH-ACELL PERTUSSIS 5-2.5-18.5 LF-MCG/0.5 IM SUSP
0.5000 mL | Freq: Once | INTRAMUSCULAR | Status: AC
  Administered 2015-10-26: 0.5 mL via INTRAMUSCULAR
  Filled 2015-10-26: qty 0.5

## 2015-10-26 MED ORDER — ALBUTEROL SULFATE HFA 108 (90 BASE) MCG/ACT IN AERS: 2.0000 | INHALATION_SPRAY | Freq: Four times a day (QID) | RESPIRATORY_TRACT | Status: AC | PRN

## 2015-10-26 MED ORDER — MONTELUKAST SODIUM 10 MG PO TABS: 10.0000 mg | ORAL_TABLET | Freq: Every day | ORAL | Status: AC

## 2015-10-26 MED ORDER — LIDOCAINE HCL (PF) 1 % IJ SOLN
5.0000 mL | Freq: Once | INTRAMUSCULAR | Status: AC
  Administered 2015-10-26: 5 mL
  Filled 2015-10-26: qty 5

## 2015-10-26 MED ORDER — BUDESONIDE-FORMOTEROL FUMARATE 80-4.5 MCG/ACT IN AERO: 2.0000 | INHALATION_SPRAY | Freq: Two times a day (BID) | RESPIRATORY_TRACT | Status: AC

## 2015-10-26 NOTE — ED Provider Notes (Signed)
CSN: [id number]     Arrival date & time 10/26/15  1257 History   First MD Initiated Contact with Patient 10/26/15 1330     Chief Complaint  Patient presents with  . Extremity Laceration     (Consider location/radiation/quality/duration/timing/severity/associated sxs/prior Treatment) The history is provided by the patient and medical records. No language interpreter was used.     Rebekah Glenn is a 50 y.o. female  with a hx of asthma presents to the Emergency Department complaining of acute persistent laceration to the left index finger onset PTA.  Pt reports she was opening a can when this happened.  Pt's tetanus is not UTD. No aggravating or alleviating factors.  Pt denies numbness, tingling, weakness.  Pt also requests a refill of her asthma medication.  She does not have a PCP in the area.  Pt needs refills for  Albuterol rescue inhaler, Symbicort and Singulair.   Past Medical History  Diagnosis Date  . Asthma    Past Surgical History  Procedure Laterality Date  . Rotator cuff repair      R arm  . Hernia repair    . Tubal ligation    . Laparoscopic gastric banding  2005  . Laparoscopic repair and removal of gastric band  09/2012  . Laparoscopic gastric sleeve resection     No family history on file. Social History  Substance Use Topics  . Smoking status: Former Games developer  . Smokeless tobacco: Never Used  . Alcohol Use: 0.6 oz/week    1 Cans of beer per week   OB History    No data available     Review of Systems  Constitutional: Negative for fever.  Gastrointestinal: Negative for nausea and vomiting.  Skin: Positive for wound.  Allergic/Immunologic: Negative for immunocompromised state.  Neurological: Negative for weakness and numbness.  Hematological: Does not bruise/bleed easily.  Psychiatric/Behavioral: The patient is not nervous/anxious.       Allergies  Vicodin  Home Medications   Prior to Admission medications   Medication Sig Start Date End  Date Taking? Authorizing Provider  albuterol (PROVENTIL HFA;VENTOLIN HFA) 108 (90 Base) MCG/ACT inhaler Inhale 2 puffs into the lungs every 6 (six) hours as needed for wheezing or shortness of breath. 10/26/15   Raymonda Pell, PA-C  budesonide-formoterol (SYMBICORT) 80-4.5 MCG/ACT inhaler Inhale 2 puffs into the lungs 2 (two) times daily. 10/26/15   Jermond Burkemper, PA-C  methocarbamol (ROBAXIN) 500 MG tablet Take 1 tablet (500 mg total) by mouth 2 (two) times daily. 01/08/15   Fayrene Helper, PA-C  montelukast (SINGULAIR) 10 MG tablet Take 1 tablet (10 mg total) by mouth at bedtime. 10/26/15   Jacqueline Spofford, PA-C  naproxen (NAPROSYN) 500 MG tablet Take 1 tablet (500 mg total) by mouth 2 (two) times daily. 04/19/15   Alvira Monday, MD  traMADol (ULTRAM) 50 MG tablet Take 1 tablet (50 mg total) by mouth every 6 (six) hours as needed. 04/19/15   Alvira Monday, MD   BP 115/82 mmHg  Pulse 71  Temp(Src) 98.5 F (36.9 C) (Oral)  Resp 18  Ht  (1.702 m)  Wt 128.368 kg  BMI 44.31 kg/m2  SpO2 100% Physical Exam  Constitutional: She is oriented to person, place, and time. She appears well-developed and well-nourished. No distress.  HENT:  Head: Normocephalic and atraumatic.  Eyes: Conjunctivae are normal. No scleral icterus.  Neck: Normal range of motion.  Cardiovascular: Normal rate, regular rhythm, normal heart sounds and intact distal pulses.   No  murmur heard. Capillary refill < 3 sec  Pulmonary/Chest: Effort normal and breath sounds normal. No respiratory distress.  Musculoskeletal: Normal range of motion. She exhibits no edema.  ROM: Full ROM of the left index finger  Neurological: She is alert and oriented to person, place, and time.  Sensation: intact to dull and sharp Strength: 5/5 with flexion and extension   Skin: Skin is warm and dry. She is not diaphoretic.  1cm laceration to the lateral side of the left index finger, just lateral to the nailbed  Psychiatric: She has  a normal mood and affect.  Nursing note and vitals reviewed.   ED Course  .Marland KitchenLaceration Repair Date/Time: 10/26/2015 2:27 PM Performed by: Dierdre Forth Authorized by: Dierdre Forth Consent: Verbal consent obtained. Risks and benefits: risks, benefits and alternatives were discussed Consent given by: patient Patient understanding: patient states understanding of the procedure being performed Patient consent: the patient's understanding of the procedure matches consent given Procedure consent: procedure consent matches procedure scheduled Relevant documents: relevant documents present and verified Site marked: the operative site was marked Required items: required blood products, implants, devices, and special equipment available Patient identity confirmed: verbally with patient and arm band Time out: Immediately prior to procedure a "time out" was called to verify the correct patient, procedure, equipment, support staff and site/side marked as required. Body area: upper extremity Location details: left index finger Laceration length: 1 cm Foreign bodies: no foreign bodies Tendon involvement: none Nerve involvement: none Vascular damage: no Anesthesia: local infiltration Local anesthetic: lidocaine 1% without epinephrine Anesthetic total: 2 ml Patient sedated: no Preparation: Patient was prepped and draped in the usual sterile fashion. Irrigation solution: saline Irrigation method: syringe Amount of cleaning: extensive Debridement: none Degree of undermining: none Wound skin closure material used: 6-0 Vicryl. Number of sutures: 2 Technique: simple Approximation: close Approximation difficulty: simple Dressing: 4x4 sterile gauze Patient tolerance: Patient tolerated the procedure well with no immediate complications     MDM   Final diagnoses:  Laceration of index finger, initial encounter  Medication refill   Rebekah Glenn presents for laceration.  Pressure irrigation performed. Wound explored and base of wound visualized in a bloodless field without evidence of foreign body.  Laceration occurred < 8 hours prior to repair which was well tolerated. Finger splinted.  Tdap updated.  Pt has no comorbidities to effect normal wound healing. Pt discharged without antibiotics.  Discussed suture home care with patient and answered questions. Pt to follow-up for wound check and suture removal in 7 days; they are to return to the ED sooner for signs of infection. Pt is hemodynamically stable with no complaints prior to dc.   Patient's asthma medications refilled. She was given a resource guide to find a primary care physician.   Dahlia Client Bernardina Cacho, PA-C 10/26/15 1431  Alvira Monday, MD 10/27/15 [phone]

## 2015-10-26 NOTE — ED Notes (Signed)
Pt reports she cut her left index finger on a can pta.

## 2015-10-26 NOTE — Discharge Instructions (Signed)
1. Medications: Tylenol or ibuprofen for pain, usual home medications 2. Treatment: ice for swelling, keep wound clean with warm soap and water and keep bandage dry, do not submerge in water for 24 hours 3. Follow Up:  Her sutures will come out on their own in approximately 7 days. Return to the emergency department for increased redness, drainage of pus from the wound   WOUND CARE  Keep area clean and dry for 24 hours. Do not remove bandage, if applied.  After 24 hours, remove bandage and wash wound gently with mild soap and warm water. Reapply a new bandage after cleaning wound, if directed.   Continue daily cleansing with soap and water until stitches/staples are removed.  Do not apply any ointments or creams to the wound while stitches/staples are in place, as this may cause delayed healing. Return if you experience any of the following signs of infection: Swelling, redness, pus drainage, streaking, fever >101.0 F  Return if you experience excessive bleeding that does not stop after 15-20 minutes of constant, firm pressure.    Emergency Department Resource Guide 1) Find a Doctor and Pay Out of Pocket Although you won't have to find out who is covered by your insurance plan, it is a good idea to ask around and get recommendations. You will then need to call the office and see if the doctor you have chosen will accept you as a new patient and what types of options they offer for patients who are self-pay. Some doctors offer discounts or will set up payment plans for their patients who do not have insurance, but you will need to ask so you aren't surprised when you get to your appointment.  2) Contact Your Local Health Department Not all health departments have doctors that can see patients for sick visits, but many do, so it is worth a call to see if yours does. If you don't know where your local health department is, you can check in your phone book. The CDC also has a tool to help you  locate your state's health department, and many state websites also have listings of all of their local health departments.  3) Find a Walk-in Clinic If your illness is not likely to be very severe or complicated, you may want to try a walk in clinic. These are popping up all over the country in pharmacies, drugstores, and shopping centers. They're usually staffed by nurse practitioners or physician assistants that have been trained to treat common illnesses and complaints. They're usually fairly quick and inexpensive. However, if you have serious medical issues or chronic medical problems, these are probably not your best option.  No Primary Care Doctor: - Call Health Connect at  [phone] - they can help you locate a primary care doctor that  accepts your insurance, provides certain services, etc. - Physician Referral Service- [phone]  Chronic Pain Problems: Organization         Address  Phone   Notes  [facility]  [phone] Patients need to be referred by their primary care doctor.   Medication Assistance: Organization         Address  Phone   Notes  [facility] Medication [facility] [address] [place], [place] [address] [phone] --Must be a resident of [facility] -- Must have NO insurance coverage whatsoever (no Medicaid/ Medicare, etc.) -- The pt. MUST have a primary care doctor that directs their care regularly and follows them  in the community   [facility]  [phone]   [employer]  [phone]    Agencies that provide inexpensive medical care: Organization         Address  Phone   Notes  [facility]  [phone]   [facility]    [phone]   [facility] [address] [place], [place] [address] [phone]   [facility] [address] [place] [place] [phone]   Planned Parenthood    ([phone]   [facility]    [phone]   [facility]  [address], [place] Phone:  [phone], Fax:  [phone] Hours of Operation:  9 am - 6 pm, M-F.  Also accepts Medicaid/Medicare and self-pay.  [facility]  [address], [place] Phone: [phone], Fax: [phone]. Hours of Operation:  8:30 am - 5:30 pm, M-F.  Also accepts Medicaid and self-pay.  [facility] [place] [address] [place] Phone: [phone]   [facility] [address] Natasha Bence [place], [place] [phone], Ext. [phone] Mondays & Thursdays: 7-9 AM.  First 15 patients are seen on a first come, first serve basis.    Medicaid-accepting [facility] Providers:  Organization         Address  Phone   Notes  [facility] [address], [place] [phone] Also accepts self-pay patients.  [facility] [address] Laurell Josephs [place], [place]  [phone]   [facility] [address], [place] [phone]   [facility] [address], [place] [phone]   Renaye Rakers [address], [place]   [phone] Only accepts [place] patients after they have their name applied to their card.   Self-Pay (no insurance) in [facility]:  Organization         Address  Phone   Notes  Sickle Cell Patients, [facility] [address] [place], [place] [phone]   [facility] [address] [place], [place] [phone]   [facility] [place]  [address], [place] [phone]   [facility]/Dr. Osei-Bonsu  [address] [place] or [address], [place] [phone] Phone number for both [place] and [place] locations is the same.  [facility] [address]  [place] [phone]   [facility] [address], [place] or [address] [phone]   [facility] [address], [place] [phone], phone; [phone], fax Sees patients 1st and 3rd Saturday of every month.  Must not qualify for public or private insurance (i.e. Medicaid, Medicare, [place] Health Choice, Veterans' Benefits)  Household income should be no more than 200% of the poverty level The clinic cannot treat you if you are pregnant or think you are pregnant  Sexually transmitted diseases are not treated at the clinic.    Dental Care: Organization         Address  Phone  Notes  [facility] of [facility] [address] [place], [place] [phone] Accepts children up to age 58 who are enrolled in  Medicaid or [place] Health Choice; pregnant women with a Medicaid card; and children who have applied for Medicaid or [place] Health Choice, but were declined, whose parents can pay a reduced fee at time of service.  [facility] Department of [facility]  [address], [place] [phone] Accepts children up to age 73 who are enrolled in [place] or [place] Health Choice; pregnant women with a Medicaid card; and children who have applied for Medicaid or [employer] Health Choice, but were declined, whose parents can pay a reduced fee at time of service.  [facility]  [address] [place], [place] [phone] Patients are seen by appointment only. Walk-ins are not accepted. [facility] will see patients 2 years of age and older. Monday - Tuesday (8am-5pm) Most Wednesdays (8:30-5pm) $30 per visit, cash only  [facility]  [address], [facility] [phone] Patients are seen by appointment only. Walk-ins are not accepted. [facility] will see patients 78 years of age and older. One Wednesday Evening  (Monthly: Volunteer Based).  $30 per visit, cash only  [employer]  [phone] for adults; Children under age 25, call Graduate Pediatric Dentistry at [phone]. Children aged 58-14, please call [phone] to request a pediatric application.  Dental services are provided in all areas of dental care including fillings, crowns and bridges, complete and partial dentures, implants, gum treatment, root canals, and extractions. Preventive care is also provided. Treatment is provided to both adults and children. Patients are selected via a lottery and there is often a waiting list.   [facility] [address], [place]  [phone] [email or web address]   [facility] [address] [place], [place] [phone], Ext. [phone] Second and Fourth Thursday of each month, opens at 6:30 AM; Clinic ends at 9 AM.  Patients are seen on a first-come first-served basis, and a limited number are seen during each clinic.   [facility]  [address] Ether Griffins [place], [place] [phone]   Eligibility Requirements You must have lived in [place], [place], or [place] counties for at least the last three months.   You cannot be eligible for state or federal sponsored [place], including [employer], [place], or [employer].   You generally cannot be eligible for healthcare insurance through your employer.    How to apply: Eligibility screenings are held every Tuesday and Wednesday afternoon from 1:00 pm until 4:00 pm. You do not need an appointment for the interview!  [facility] [address] [place], [place] [phone]   [facility] Health Department  [phone]   [facility] Health Department  [phone]   [facility] Health Department  [phone]    Behavioral Health Resources in the Community: Intensive Outpatient Programs Organization         Address  Phone  Notes  [facility] [address] [place], [place] [phone]   [facility] [address] [place], [place] [phone]   [facility] [address] [place], [place]  [phone]   [facility] Mental [facility] [address],  [place], [place] [phone] or [phone]   Substance Abuse Resources Organization         Address  Phone  Notes  [facility]  [phone]   [facility]  [phone]   [employer]  [phone]   Floydene Flock  [phone]   Residential & Outpatient Substance Abuse Program  [phone]   Psychological Services Organization         Address  Phone  Notes  [facility]  [phone]   [facility]  [phone]   [facility] Mental Health [address] [place] [phone] or [phone]    Mobile Crisis Teams Organization         Address  Phone  Notes  [facility], Mobile Crisis Care Unit  [phone]   [facility]  [address] [place], [place] [phone]   Doristine Locks [address] [place] [phone]    Self-Help/Support Groups Organization         Address  Phone             Notes  [facility] [place] - variety of support groups  336- I7437963 Call for more information  Narcotics Anonymous (NA), [facility] [address], [employer] [place]  2 meetings at this location   Statistician         Address  Phone  Notes  [facility] [address],    [place]  [phone]   [facility]  [address] [place] [address], [place], [place] [phone]   [facility] [address] [place], [place] [phone] Admissions: 8am-3pm M-F  [facility] [address],    [place], [place] [phone]   [facility] [address] [place],  [place], [place] [phone]   [facility] [address],  [place], [place] [phone]   [facility] - Intensive Outpatient [address] Laure[address], [place], [place] [phone]   [facility] ([facility] [address]  [place], [place] [phone] or [phone]   [facility] ([facility]) [address], [place], [place] [phone] Accepts Medicaid  [facility] [address]  [place] [phone] Substance Abuse/Addiction Treatment   [facility] Organization         Address  Phone  Notes  [facility]  [phone]   Angie Fava, PhD [address] Ervin Knack [place], [place]   [phone] or [phone]   [facility] Behavioral   [address] [place], [place] [phone]   [facility] [address], [place], [place] [phone] Insurance/Medicaid/sponsorship through [facility] and Families [address]                                    [place], [place] [phone] Therapy/tele-psych/case  [facility] [address], [place] [phone]    Dr. Lolly Mustache  [phone]   [facility]  [employer] [facility] Dept. 1) [address] [place] 2) [address], [place] 3)  [address], [place] [phone]  [phone]   [facility] Child Abuse Hotline [phone] or [phone] (After Hours)

## 2016-12-11 ENCOUNTER — Encounter (HOSPITAL_BASED_OUTPATIENT_CLINIC_OR_DEPARTMENT_OTHER): Payer: Self-pay | Admitting: *Deleted

## 2016-12-11 ENCOUNTER — Emergency Department (HOSPITAL_BASED_OUTPATIENT_CLINIC_OR_DEPARTMENT_OTHER): Payer: No Typology Code available for payment source

## 2016-12-11 ENCOUNTER — Emergency Department (HOSPITAL_BASED_OUTPATIENT_CLINIC_OR_DEPARTMENT_OTHER)
Admission: EM | Admit: 2016-12-11 | Discharge: 2016-12-11 | Disposition: A | Discharge status: Home / Self Care | Payer: No Typology Code available for payment source | Attending: Emergency Medicine | Admitting: Emergency Medicine

## 2016-12-11 DIAGNOSIS — R34 Anuria and oliguria: Secondary | ICD-10-CM | POA: Diagnosis not present

## 2016-12-11 DIAGNOSIS — Z79899 Other long term (current) drug therapy: Secondary | ICD-10-CM | POA: Diagnosis not present

## 2016-12-11 DIAGNOSIS — R51 Headache: Secondary | ICD-10-CM | POA: Diagnosis present

## 2016-12-11 DIAGNOSIS — G43009 Migraine without aura, not intractable, without status migrainosus: Secondary | ICD-10-CM | POA: Insufficient documentation

## 2016-12-11 DIAGNOSIS — Z87891 Personal history of nicotine dependence: Secondary | ICD-10-CM | POA: Insufficient documentation

## 2016-12-11 DIAGNOSIS — J45909 Unspecified asthma, uncomplicated: Secondary | ICD-10-CM | POA: Insufficient documentation

## 2016-12-11 LAB — URINALYSIS, ROUTINE W REFLEX MICROSCOPIC
Bilirubin Urine: NEGATIVE
Glucose, UA: NEGATIVE mg/dL
Hgb urine dipstick: NEGATIVE
KETONES UR: NEGATIVE mg/dL
LEUKOCYTES UA: NEGATIVE
Nitrite: NEGATIVE
PROTEIN: NEGATIVE mg/dL
Specific Gravity, Urine: 1.01 (ref 1.005–1.030)
pH: 8 (ref 5.0–8.0)

## 2016-12-11 LAB — BASIC METABOLIC PANEL
ANION GAP: 3 — AB (ref 5–15)
BUN: 9 mg/dL (ref 6–20)
CALCIUM: 8.5 mg/dL — AB (ref 8.9–10.3)
CO2: 26 mmol/L (ref 22–32)
Chloride: 107 mmol/L (ref 101–111)
Creatinine, Ser: 0.81 mg/dL (ref 0.44–1.00)
Glucose, Bld: 100 mg/dL — ABNORMAL HIGH (ref 65–99)
POTASSIUM: 4 mmol/L (ref 3.5–5.1)
SODIUM: 136 mmol/L (ref 135–145)

## 2016-12-11 LAB — CBC
HCT: 37.7 % (ref 36.0–46.0)
Hemoglobin: 13.4 g/dL (ref 12.0–15.0)
MCH: 28.9 pg (ref 26.0–34.0)
MCHC: 35.5 g/dL (ref 30.0–36.0)
MCV: 81.4 fL (ref 78.0–100.0)
PLATELETS: 284 10*3/uL (ref 150–400)
RBC: 4.63 MIL/uL (ref 3.87–5.11)
RDW: 13.5 % (ref 11.5–15.5)
WBC: 7.1 10*3/uL (ref 4.0–10.5)

## 2016-12-11 MED ORDER — DIPHENHYDRAMINE HCL 50 MG/ML IJ SOLN
12.5000 mg | Freq: Once | INTRAMUSCULAR | Status: AC
  Administered 2016-12-11: 12.5 mg via INTRAVENOUS
  Filled 2016-12-11: qty 1

## 2016-12-11 MED ORDER — PROCHLORPERAZINE EDISYLATE 5 MG/ML IJ SOLN
10.0000 mg | Freq: Once | INTRAMUSCULAR | Status: AC
  Administered 2016-12-11: 10 mg via INTRAVENOUS
  Filled 2016-12-11: qty 2

## 2016-12-11 NOTE — ED Provider Notes (Signed)
[facility] Provider Note   CSN: [id number] Arrival date & time: 12/11/16  4098     History   Chief Complaint Chief Complaint  Patient presents with  . Headache    HPI Rebekah Glenn is a 51 y.o. female.  HPI Pt used to have migraines years ago but has not had one in over 8 years.  She woke up this Am with a severe headache across the front of her head.  No radiation of the pain.  No vomiting.  Her vision feels blurry.  No vomiting.   No diarrhea. No fevers or chills.  No focal numbness or weakness except the left side of the face feels numb. Past Medical History:  Diagnosis Date  . Asthma     There are no active problems to display for this patient.   Past Surgical History:  Procedure Laterality Date  . HERNIA REPAIR    . LAPAROSCOPIC GASTRIC BANDING  2005  . LAPAROSCOPIC GASTRIC SLEEVE RESECTION    . LAPAROSCOPIC REPAIR AND REMOVAL OF GASTRIC BAND  09/2012  . ROTATOR CUFF REPAIR     R arm  . TUBAL LIGATION      OB History    No data available       Home Medications    Prior to Admission medications   Medication Sig Start Date End Date Taking? Authorizing Provider  albuterol (PROVENTIL HFA;VENTOLIN HFA) 108 (90 Base) MCG/ACT inhaler Inhale 2 puffs into the lungs every 6 (six) hours as needed for wheezing or shortness of breath. 10/26/15   Hannah Muthersbaugh, PA-C  budesonide-formoterol (SYMBICORT) 80-4.5 MCG/ACT inhaler Inhale 2 puffs into the lungs 2 (two) times daily. 10/26/15   Hannah Muthersbaugh, PA-C  methocarbamol (ROBAXIN) 500 MG tablet Take 1 tablet (500 mg total) by mouth 2 (two) times daily. 01/08/15   Fayrene Helper, PA-C  montelukast (SINGULAIR) 10 MG tablet Take 1 tablet (10 mg total) by mouth at bedtime. 10/26/15   Hannah Muthersbaugh, PA-C  naproxen (NAPROSYN) 500 MG tablet Take 1 tablet (500 mg total) by mouth 2 (two) times daily. 04/19/15   Alvira Monday, MD  traMADol (ULTRAM) 50 MG tablet Take 1 tablet (50 mg total) by mouth every 6 (six)  hours as needed. 04/19/15   Alvira Monday, MD    Family History No family history on file.  Social History Social History  Substance Use Topics  . Smoking status: Former Games developer  . Smokeless tobacco: Never Used  . Alcohol use 0.6 oz/week    1 Cans of beer per week     Allergies   Vicodin [hydrocodone-acetaminophen]   Review of Systems Review of Systems  Constitutional: Negative for fever.  Genitourinary: Positive for decreased urine volume.  All other systems reviewed and are negative.    Physical Exam Updated Vital Signs BP 141/83   Pulse 62   Temp 98 F (36.7 C) (Oral)   Resp 14   Ht 5\' 7"  (1.702 m)   Wt (!) 140.2 kg   SpO2 100%   BMI 48.40 kg/m   Physical Exam  Constitutional: No distress.  Overweight   HENT:  Head: Normocephalic and atraumatic.  Right Ear: External ear normal.  Left Ear: External ear normal.  Eyes: Conjunctivae are normal. Right eye exhibits no discharge. Left eye exhibits no discharge. No scleral icterus.  Neck: Neck supple. No tracheal deviation present.  Cardiovascular: Normal rate, regular rhythm and intact distal pulses.   Pulmonary/Chest: Effort normal and breath sounds normal. No stridor. No respiratory  distress. She has no wheezes. She has no rales.  Abdominal: Soft. Bowel sounds are normal. She exhibits no distension. There is no tenderness. There is no rebound and no guarding.  Musculoskeletal: She exhibits no edema or tenderness.  Neurological: She is alert. She has normal strength. No cranial nerve deficit (no facial droop, extraocular movements intact, no slurred speech) or sensory deficit. She exhibits normal muscle tone. She displays no seizure activity. Coordination normal.  Skin: Skin is warm and dry. No rash noted.  Psychiatric: She has a normal mood and affect.  Nursing note and vitals reviewed.    ED Treatments / Results  Labs (all labs ordered are listed, but only abnormal results are displayed) Labs Reviewed    BASIC METABOLIC PANEL - Abnormal; Notable for the following:       Result Value   Glucose, Bld 100 (*)    Calcium 8.5 (*)    Anion gap 3 (*)    All other components within normal limits  URINALYSIS, ROUTINE W REFLEX MICROSCOPIC - Abnormal; Notable for the following:    APPearance CLOUDY (*)    All other components within normal limits  CBC    EKG  EKG Interpretation None       Radiology Ct Head Wo Contrast  Result Date: 12/11/2016 CLINICAL DATA:  51 year old with recent onset of fatigue, generalized weakness, headaches, nausea, intermittent dizziness and left facial numbness. EXAM: CT HEAD WITHOUT CONTRAST TECHNIQUE: Contiguous axial images were obtained from the base of the skull through the vertex without intravenous contrast. COMPARISON:  None. FINDINGS: Brain: Ventricular system normal in size and appearance for age. No mass lesion. No midline shift. No acute hemorrhage or hematoma. No extra-axial fluid collections. No evidence of acute infarction. No focal brain parenchymal abnormalities. Vascular: No visible atherosclerosis. Skull: No skull fracture or other focal osseous abnormality involving the skull. Sinuses/Orbits: Minimal, insignificant mucosal thickening involving the right maxillary sinus. Remaining paranasal sinuses, mastoid air cells and middle ear cavities well-aerated. Normal appearing orbits. Other: None. IMPRESSION: Normal examination. Electronically Signed   By: Hulan Saashomas  Lawrence M.D.   On: 12/11/2016 07:56    Procedures Procedures (including critical care time)  Medications Ordered in ED Medications  prochlorperazine (COMPAZINE) injection 10 mg (10 mg Intravenous Given 12/11/16 0758)  diphenhydrAMINE (BENADRYL) injection 12.5 mg (12.5 mg Intravenous Given 12/11/16 0758)     Initial Impression / Assessment and Plan / ED Course  I have reviewed the triage vital signs and the nursing notes.  Pertinent labs & imaging results that were available during my care of  the patient were reviewed by me and considered in my medical decision making (see chart for details).  Clinical Course as of Dec 12 927  Fri Dec 11, 2016  0927 Feeling better.  Ready for discharge  [JK]    Clinical Course User Index [JK] Linwood DibblesJon Breunna Nordmann, MD  the patient's laboratory tests and CT scan are normal. No evidence to suggest acute infection, dehydration, anemia.  CT scan does not show any evidence of bleeding, stroke or tumor.  I suspect the patient's symptoms are related to a migraine headache. It has been a number of years and she has had one he does have a history of migraines.  Patient's symptoms improved with treatment. She is ready for discharge.  Final Clinical Impressions(s) / ED Diagnoses   Final diagnoses:  Migraine without aura and without status migrainosus, not intractable    New Prescriptions New Prescriptions   No medications on file  Linwood Dibbles, MD 12/11/16 [phone]

## 2016-12-11 NOTE — Discharge Instructions (Signed)
Follow-up with a primary care doctor as needed.  Take over-the-counter medications as needed for headache

## 2016-12-11 NOTE — ED Triage Notes (Addendum)
Fatigue onset wednesday,  Sent to bed woke yesterday w increased thrist and ha  Went back to bed last pm,  Woke w worse ha this am, join and muscle pain  Feels like brick on tongue also blurred vision

## 2017-02-18 ENCOUNTER — Encounter (HOSPITAL_BASED_OUTPATIENT_CLINIC_OR_DEPARTMENT_OTHER): Payer: Self-pay | Admitting: Emergency Medicine

## 2017-02-18 ENCOUNTER — Emergency Department (HOSPITAL_BASED_OUTPATIENT_CLINIC_OR_DEPARTMENT_OTHER)
Admission: EM | Admit: 2017-02-18 | Discharge: 2017-02-19 | Disposition: A | Discharge status: Home / Self Care | Payer: No Typology Code available for payment source | Attending: Emergency Medicine | Admitting: Emergency Medicine

## 2017-02-18 DIAGNOSIS — Z79899 Other long term (current) drug therapy: Secondary | ICD-10-CM | POA: Insufficient documentation

## 2017-02-18 DIAGNOSIS — Z87891 Personal history of nicotine dependence: Secondary | ICD-10-CM | POA: Insufficient documentation

## 2017-02-18 DIAGNOSIS — J45909 Unspecified asthma, uncomplicated: Secondary | ICD-10-CM | POA: Insufficient documentation

## 2017-02-18 DIAGNOSIS — Z791 Long term (current) use of non-steroidal anti-inflammatories (NSAID): Secondary | ICD-10-CM | POA: Insufficient documentation

## 2017-02-18 DIAGNOSIS — E162 Hypoglycemia, unspecified: Secondary | ICD-10-CM

## 2017-02-18 DIAGNOSIS — R42 Dizziness and giddiness: Secondary | ICD-10-CM | POA: Diagnosis present

## 2017-02-18 LAB — CBC WITH DIFFERENTIAL/PLATELET
Basophils Absolute: 0 10*3/uL (ref 0.0–0.1)
Basophils Relative: 0 %
EOS ABS: 0.2 10*3/uL (ref 0.0–0.7)
EOS PCT: 2 %
HCT: 37.3 % (ref 36.0–46.0)
HEMOGLOBIN: 13.6 g/dL (ref 12.0–15.0)
Lymphocytes Relative: 23 %
Lymphs Abs: 2.1 10*3/uL (ref 0.7–4.0)
MCH: 29.4 pg (ref 26.0–34.0)
MCHC: 36.5 g/dL — ABNORMAL HIGH (ref 30.0–36.0)
MCV: 80.6 fL (ref 78.0–100.0)
Monocytes Absolute: 0.7 10*3/uL (ref 0.1–1.0)
Monocytes Relative: 8 %
NEUTROS PCT: 67 %
Neutro Abs: 6.3 10*3/uL (ref 1.7–7.7)
Platelets: 274 10*3/uL (ref 150–400)
RBC: 4.63 MIL/uL (ref 3.87–5.11)
RDW: 13.6 % (ref 11.5–15.5)
WBC: 9.4 10*3/uL (ref 4.0–10.5)

## 2017-02-18 LAB — COMPREHENSIVE METABOLIC PANEL
ALK PHOS: 84 U/L (ref 38–126)
ALT: 22 U/L (ref 14–54)
AST: 28 U/L (ref 15–41)
Albumin: 3.7 g/dL (ref 3.5–5.0)
Anion gap: 7 (ref 5–15)
BUN: 13 mg/dL (ref 6–20)
CALCIUM: 8.7 mg/dL — AB (ref 8.9–10.3)
CO2: 28 mmol/L (ref 22–32)
CREATININE: 0.99 mg/dL (ref 0.44–1.00)
Chloride: 100 mmol/L — ABNORMAL LOW (ref 101–111)
GFR calc non Af Amer: >60 mL/min (ref >60)
Glucose, Bld: 81 mg/dL (ref 65–99)
Potassium: 3.8 mmol/L (ref 3.5–5.1)
SODIUM: 135 mmol/L (ref 135–145)
Total Bilirubin: 0.4 mg/dL (ref 0.3–1.2)
Total Protein: 7.5 g/dL (ref 6.5–8.1)

## 2017-02-18 LAB — URINALYSIS, ROUTINE W REFLEX MICROSCOPIC
BILIRUBIN URINE: NEGATIVE
GLUCOSE, UA: NEGATIVE mg/dL
KETONES UR: NEGATIVE mg/dL
Leukocytes, UA: NEGATIVE
NITRITE: NEGATIVE
PH: 6 (ref 5.0–8.0)
Protein, ur: NEGATIVE mg/dL
Specific Gravity, Urine: 1.008 (ref 1.005–1.030)

## 2017-02-18 LAB — CBG MONITORING, ED: Glucose-Capillary: 84 mg/dL (ref 65–99)

## 2017-02-18 LAB — URINALYSIS, MICROSCOPIC (REFLEX)

## 2017-02-18 MED ORDER — SODIUM CHLORIDE 0.9 % IV BOLUS (SEPSIS)
1000.0000 mL | Freq: Once | INTRAVENOUS | Status: AC
  Administered 2017-02-18: 1000 mL via INTRAVENOUS

## 2017-02-18 NOTE — ED Triage Notes (Signed)
EMS called due to pt feeling weak. EMS found pt to have CBG 46 initially. Pt is s/p gastric sleeve and has had low caloric intake last couple of days and worked out x 2 today. Pt drank gatorade at home with increase of CBG to 144. EMS reports pt was orthostatic as well. IV fluid initiated by EMS.

## 2017-02-19 LAB — CBG MONITORING, ED: GLUCOSE-CAPILLARY: 160 mg/dL — AB (ref 65–99)

## 2017-02-19 NOTE — ED Provider Notes (Signed)
[facility] Provider Note: Lowella DellJ. Lane Candita Borenstein, MD, FACEP  CSN: [id number] MRN: [id number] ARRIVAL: 02/18/17 at 2154 ROOM: MH01/MH01   CHIEF COMPLAINT  Hypoglycemia   HISTORY OF PRESENT ILLNESS  Rebekah Glenn is a 51 y.o. female with a history of gastric sleeve but no history of hypoglycemia. She has increased her exercise regimen and did a "double work out" today. She also had very little to eat today. She was in a store about 9 PM and began feeling lightheaded and weak. She leaned against a cabinet and slid to the ground. She did not fall or hurt herself. She did not fully lose consciousness but was confused. EMS was called and she was found to have a blood sugar of 46. She was given Gatorade to drink as well as candy to eat. Her sugar increased to 144 prior to arrival and was 84 here. She now feels back to normal and wants to eat.   Past Medical History:  Diagnosis Date  . Asthma     Past Surgical History:  Procedure Laterality Date  . HERNIA REPAIR    . LAPAROSCOPIC GASTRIC BANDING  2005  . LAPAROSCOPIC GASTRIC SLEEVE RESECTION    . LAPAROSCOPIC REPAIR AND REMOVAL OF GASTRIC BAND  09/2012  . ROTATOR CUFF REPAIR     R arm  . TUBAL LIGATION      No family history on file.  Social History  Substance Use Topics  . Smoking status: Former Games developermoker  . Smokeless tobacco: Never Used  . Alcohol use 0.6 oz/week    1 Cans of beer per week    Prior to Admission medications   Medication Sig Start Date End Date Taking? Authorizing Provider  albuterol (PROVENTIL HFA;VENTOLIN HFA) 108 (90 Base) MCG/ACT inhaler Inhale 2 puffs into the lungs every 6 (six) hours as needed for wheezing or shortness of breath. 10/26/15   Muthersbaugh, Dahlia ClientHannah, PA-C  budesonide-formoterol (SYMBICORT) 80-4.5 MCG/ACT inhaler Inhale 2 puffs into the lungs 2 (two) times daily. 10/26/15   Muthersbaugh, Dahlia ClientHannah, PA-C  methocarbamol (ROBAXIN) 500 MG tablet Take 1 tablet (500 mg total) by mouth 2 (two) times  daily. 01/08/15   Fayrene Helperran, Bowie, PA-C  montelukast (SINGULAIR) 10 MG tablet Take 1 tablet (10 mg total) by mouth at bedtime. 10/26/15   Muthersbaugh, Dahlia ClientHannah, PA-C  naproxen (NAPROSYN) 500 MG tablet Take 1 tablet (500 mg total) by mouth 2 (two) times daily. 04/19/15   Alvira MondaySchlossman, Erin, MD  traMADol (ULTRAM) 50 MG tablet Take 1 tablet (50 mg total) by mouth every 6 (six) hours as needed. 04/19/15   Alvira MondaySchlossman, Erin, MD    Allergies Vicodin [hydrocodone-acetaminophen]   REVIEW OF SYSTEMS  Negative except as noted here or in the History of Present Illness.   PHYSICAL EXAMINATION  Initial Vital Signs Blood pressure 128/90, pulse 71, temperature 98 F (36.7 C), temperature source Oral, resp. rate 20, height 5\' 7"  (1.702 m), weight (!) 308 lb (139.7 kg), SpO2 98 %.  Examination General: Well-developed, obese female in no acute distress; appearance consistent with age of record HENT: normocephalic; atraumatic Eyes: pupils equal, round and reactive to light; extraocular muscles intact Neck: supple Heart: regular rate and rhythm Lungs: clear to auscultation bilaterally Abdomen: soft; nondistended; nontender; bowel sounds present Extremities: No deformity; full range of motion; pulses normal Neurologic: Awake, alert and oriented; motor function intact in all extremities and symmetric; no facial droop Skin: Warm and dry Psychiatric: Normal mood and affect   RESULTS  Summary of this visit's results,  reviewed by myself:   EKG Interpretation  Date/Time:    Ventricular Rate:    PR Interval:    QRS Duration:   QT Interval:    QTC Calculation:   R Axis:     Text Interpretation:        Laboratory Studies: Results for orders placed or performed during the hospital encounter of 02/18/17 (from the past 24 hour(s))  CBC with Differential     Status: Abnormal   Collection Time: 02/18/17 10:01 PM  Result Value Ref Range   WBC 9.4 4.0 - 10.5 K/uL   RBC 4.63 3.87 - 5.11 MIL/uL   Hemoglobin 13.6  12.0 - 15.0 g/dL   HCT 16.1 09.6 - 04.5 %   MCV 80.6 78.0 - 100.0 fL   MCH 29.4 26.0 - 34.0 pg   MCHC 36.5 (H) 30.0 - 36.0 g/dL   RDW 40.9 81.1 - 91.4 %   Platelets 274 150 - 400 K/uL   Neutrophils Relative % 67 %   Neutro Abs 6.3 1.7 - 7.7 K/uL   Lymphocytes Relative 23 %   Lymphs Abs 2.1 0.7 - 4.0 K/uL   Monocytes Relative 8 %   Monocytes Absolute 0.7 0.1 - 1.0 K/uL   Eosinophils Relative 2 %   Eosinophils Absolute 0.2 0.0 - 0.7 K/uL   Basophils Relative 0 %   Basophils Absolute 0.0 0.0 - 0.1 K/uL  Comprehensive metabolic panel     Status: Abnormal   Collection Time: 02/18/17 10:01 PM  Result Value Ref Range   Sodium 135 135 - 145 mmol/L   Potassium 3.8 3.5 - 5.1 mmol/L   Chloride 100 (L) 101 - 111 mmol/L   CO2 28 22 - 32 mmol/L   Glucose, Bld 81 65 - 99 mg/dL   BUN 13 6 - 20 mg/dL   Creatinine, Ser 7.82 0.44 - 1.00 mg/dL   Calcium 8.7 (L) 8.9 - 10.3 mg/dL   Total Protein 7.5 6.5 - 8.1 g/dL   Albumin 3.7 3.5 - 5.0 g/dL   AST 28 15 - 41 U/L   ALT 22 14 - 54 U/L   Alkaline Phosphatase 84 38 - 126 U/L   Total Bilirubin 0.4 0.3 - 1.2 mg/dL   GFR calc non Af Amer >60 >60 mL/min   GFR calc Af Amer >60 >60 mL/min   Anion gap 7 5 - 15  Urinalysis, Routine w reflex microscopic     Status: Abnormal   Collection Time: 02/18/17 10:01 PM  Result Value Ref Range   Color, Urine YELLOW YELLOW   APPearance CLOUDY (A) CLEAR   Specific Gravity, Urine 1.008 1.005 - 1.030   pH 6.0 5.0 - 8.0   Glucose, UA NEGATIVE NEGATIVE mg/dL   Hgb urine dipstick TRACE (A) NEGATIVE   Bilirubin Urine NEGATIVE NEGATIVE   Ketones, ur NEGATIVE NEGATIVE mg/dL   Protein, ur NEGATIVE NEGATIVE mg/dL   Nitrite NEGATIVE NEGATIVE   Leukocytes, UA NEGATIVE NEGATIVE  Urinalysis, Microscopic (reflex)     Status: Abnormal   Collection Time: 02/18/17 10:01 PM  Result Value Ref Range   RBC / HPF 0-5 0 - 5 RBC/hpf   WBC, UA 0-5 0 - 5 WBC/hpf   Bacteria, UA MANY (A) NONE SEEN   Squamous Epithelial / LPF 6-30 (A)  NONE SEEN  CBG monitoring, ED     Status: None   Collection Time: 02/18/17 11:25 PM  Result Value Ref Range   Glucose-Capillary 84 65 - 99 mg/dL  CBG monitoring,  ED     Status: Abnormal   Collection Time: 02/19/17  1:07 AM  Result Value Ref Range   Glucose-Capillary 160 (H) 65 - 99 mg/dL   Imaging Studies: No results found.  ED COURSE  Nursing notes and initial vitals signs, including pulse oximetry, reviewed.  Vitals:   02/18/17 2204 02/18/17 2205 02/19/17 0041  BP: (!) 141/80  128/90  Pulse: 80  71  Resp: 20  20  Temp: 98 F (36.7 C)    TempSrc: Oral    SpO2: 100%  98%  Weight:  (!) 308 lb (139.7 kg)   Height:  5\' 7"  (1.702 m)    1:09 AM Sugar is now 160. The patient is ready to go home and wants to eat. She was advised to contact her primary care physician regarding her hypoglycemic episode. She was advised to eat regular meals, especially if increasing her exercise regimen.  PROCEDURES    ED DIAGNOSES     ICD-9-CM ICD-10-CM   1. Hypoglycemia 251.2 E16.2        Zonnie Landen, Jonny Ruiz, MD 02/19/17 0110

## 2017-02-19 NOTE — ED Notes (Signed)
Pt verbalizes understanding of d/c instructions and denies any further need at this time. 

## 2017-04-29 ENCOUNTER — Encounter (HOSPITAL_BASED_OUTPATIENT_CLINIC_OR_DEPARTMENT_OTHER): Payer: Self-pay | Admitting: *Deleted

## 2017-04-29 ENCOUNTER — Emergency Department (HOSPITAL_BASED_OUTPATIENT_CLINIC_OR_DEPARTMENT_OTHER)
Admission: EM | Admit: 2017-04-29 | Discharge: 2017-04-29 | Disposition: A | Discharge status: Home / Self Care | Payer: PRIVATE HEALTH INSURANCE | Attending: Emergency Medicine | Admitting: Emergency Medicine

## 2017-04-29 ENCOUNTER — Emergency Department (HOSPITAL_BASED_OUTPATIENT_CLINIC_OR_DEPARTMENT_OTHER): Payer: PRIVATE HEALTH INSURANCE

## 2017-04-29 DIAGNOSIS — M25562 Pain in left knee: Secondary | ICD-10-CM

## 2017-04-29 DIAGNOSIS — J45909 Unspecified asthma, uncomplicated: Secondary | ICD-10-CM | POA: Diagnosis not present

## 2017-04-29 DIAGNOSIS — Z79899 Other long term (current) drug therapy: Secondary | ICD-10-CM | POA: Diagnosis not present

## 2017-04-29 DIAGNOSIS — Z87891 Personal history of nicotine dependence: Secondary | ICD-10-CM | POA: Diagnosis not present

## 2017-04-29 MED ORDER — OXYCODONE-ACETAMINOPHEN 5-325 MG PO TABS
2.0000 | ORAL_TABLET | Freq: Once | ORAL | Status: AC
  Administered 2017-04-29: 2 via ORAL
  Filled 2017-04-29: qty 2

## 2017-04-29 MED ORDER — KETOROLAC TROMETHAMINE 60 MG/2ML IM SOLN
60.0000 mg | Freq: Once | INTRAMUSCULAR | Status: AC
  Administered 2017-04-29: 60 mg via INTRAMUSCULAR
  Filled 2017-04-29: qty 2

## 2017-04-29 MED ORDER — OXYCODONE-ACETAMINOPHEN 5-325 MG PO TABS: 1.0000 | ORAL_TABLET | Freq: Four times a day (QID) | ORAL | 0 refills | Status: DC | PRN

## 2017-04-29 MED ORDER — IBUPROFEN 600 MG PO TABS: 600.0000 mg | ORAL_TABLET | Freq: Four times a day (QID) | ORAL | 0 refills | Status: DC | PRN

## 2017-04-29 NOTE — Discharge Instructions (Signed)
You have been seen today for a knee injury. There were no acute abnormalities on the x-rays, including no sign of fracture or dislocation. Pain: Take 600 mg of ibuprofen every 6 hours or 440 mg (over the counter dose) to 500 mg (prescription dose) of naproxen every 12 hours or for the next 3 days. After this time, these medications may be used as needed for pain. Take these medications with food to avoid upset stomach. Choose only one of these medications, do not take them together.  Tylenol: Should you continue to have additional pain while taking the ibuprofen or naproxen, you may add in tylenol as needed. Your daily total maximum amount of tylenol from all sources should be limited to 4000mg /day for persons without liver problems, or 2000mg /day for those with liver problems. Ice: May apply ice to the area over the next 24 hours for 15 minutes at a time to reduce swelling. Elevation: Keep the extremity elevated as often as possible to reduce pain and inflammation. Support: Wear the knee brace for support and comfort. Wear this until pain resolves or cleared by the orthopedist. Exercises: If you are able, start by performing these exercises a few times a week, increasing the frequency until you are performing them twice daily.  Follow up: If symptoms are improving, you may follow up with your primary care provider for any continued management. If symptoms are not improving, you may follow up with the orthopedic specialist.

## 2017-04-29 NOTE — ED Triage Notes (Signed)
Pt c/o left knee pain x 1 day w/o injury

## 2017-04-29 NOTE — ED Provider Notes (Signed)
[facility] Provider Note   CSN: [id number] Arrival date & time: 04/29/17  1731   By signing my name below, I, Rebekah Glenn, attest that this documentation has been prepared under the direction and in the presence of [name], PA-C. Electronically Signed: Thelma BargeNick Glenn, Scribe. 04/29/17. 6:56 PM. History   Chief Complaint Chief Complaint  Patient presents with  . Knee Pain    left   The history is provided by the patient. No language interpreter was used.    HPI Comments: Rebekah Glenn is a 51 y.o. female who presents to the Emergency Department complaining of pain in the left knee that began yesterday. A couple days ago she was dancing, which is something she does not usually do. Pain is 9 out of 10, throbbing, radiating towards the hip. Worse with ambulation. Has taken ibuprofen without full relief. Denies neuro deficits, falls/trauma, or any other complaints. Pt has allergies to vicodin but can take percocet without issue.    Past Medical History:  Diagnosis Date  . Asthma     There are no active problems to display for this patient.   Past Surgical History:  Procedure Laterality Date  . HERNIA REPAIR    . LAPAROSCOPIC GASTRIC BANDING  2005  . LAPAROSCOPIC GASTRIC SLEEVE RESECTION    . LAPAROSCOPIC REPAIR AND REMOVAL OF GASTRIC BAND  09/2012  . ROTATOR CUFF REPAIR     R arm  . TUBAL LIGATION      OB History    No data available       Home Medications    Prior to Admission medications   Medication Sig Start Date End Date Taking? Authorizing Provider  albuterol (PROVENTIL HFA;VENTOLIN HFA) 108 (90 Base) MCG/ACT inhaler Inhale 2 puffs into the lungs every 6 (six) hours as needed for wheezing or shortness of breath. 10/26/15   Muthersbaugh, Dahlia ClientHannah, PA-C  budesonide-formoterol (SYMBICORT) 80-4.5 MCG/ACT inhaler Inhale 2 puffs into the lungs 2 (two) times daily. 10/26/15   Muthersbaugh, Dahlia ClientHannah, PA-C  ibuprofen (ADVIL,MOTRIN) 600 MG tablet Take 1 tablet  (600 mg total) by mouth every 6 (six) hours as needed. 04/29/17   Jamee Pacholski C, PA-C  methocarbamol (ROBAXIN) 500 MG tablet Take 1 tablet (500 mg total) by mouth 2 (two) times daily. 01/08/15   Fayrene Helperran, [place], PA-C  montelukast (SINGULAIR) 10 MG tablet Take 1 tablet (10 mg total) by mouth at bedtime. 10/26/15   Muthersbaugh, Dahlia ClientHannah, PA-C  naproxen (NAPROSYN) 500 MG tablet Take 1 tablet (500 mg total) by mouth 2 (two) times daily. 04/19/15   Alvira MondaySchlossman, Erin, MD  oxyCODONE-acetaminophen (PERCOCET/ROXICET) 5-325 MG tablet Take 1-2 tablets by mouth every 6 (six) hours as needed for severe pain. 04/29/17   Taresa Montville C, PA-C  traMADol (ULTRAM) 50 MG tablet Take 1 tablet (50 mg total) by mouth every 6 (six) hours as needed. 04/19/15   Alvira MondaySchlossman, Erin, MD    Family History History reviewed. No pertinent family history.  Social History Social History  Substance Use Topics  . Smoking status: Former Games developermoker  . Smokeless tobacco: Never Used  . Alcohol use 0.6 oz/week    1 Cans of beer per week     Allergies   Vicodin [hydrocodone-acetaminophen]   Review of Systems Review of Systems  Constitutional: Negative for fever.  Musculoskeletal: Positive for arthralgias, gait problem and joint swelling.  Neurological: Negative for weakness and numbness.  All other systems reviewed and are negative.    Physical Exam Updated Vital Signs BP 129/86  Pulse 72   Temp 98.9 F (37.2 C)   Resp 16   Ht 5\' 7"  (1.702 m)   Wt (!) 143.8 kg (317 lb)   SpO2 100%   BMI 49.65 kg/m   Physical Exam  Constitutional: She appears well-developed and well-nourished. No distress.  HENT:  Head: Normocephalic and atraumatic.  Eyes: Conjunctivae are normal.  Neck: Neck supple.  Cardiovascular: Normal rate, regular rhythm and intact distal pulses.   Pulmonary/Chest: Effort normal.  Musculoskeletal: She exhibits edema and tenderness.       Left knee: She exhibits swelling. She exhibits no LCL laxity and no MCL laxity.  Tenderness found. Lateral joint line tenderness noted.  Tenderness across the anterior and lateral left knee with associated swelling. She has significantly increased pain on the lateral part of the knee with varus stress. Full flexion and extension intact in the knee, but painful. No noted effusion, bruising, erythema, increased warmth, or laxity. Patella appears to be stable and in correct anatomical position.  Neurological: She is alert.  5/5 strength with flexion and exntesion of the left knee 5/5 strength with dorsi and plantar flexion of left ankle 5/5 strength with flexion and extension of left big toe Paresthesias to the nerve distribution involving the left lateral malleolus extending to the left small toe. Significantly antalgic gait.  Skin: Skin is warm and dry. Capillary refill takes less than 2 seconds. She is not diaphoretic.  Psychiatric: She has a normal mood and affect. Her behavior is normal.  Nursing note and vitals reviewed.    ED Treatments / Results  DIAGNOSTIC STUDIES: Oxygen Saturation is 100% on RA, normal by my interpretation.    COORDINATION OF CARE: 6:48 PM Discussed treatment plan with pt at bedside and pt agreed to plan.  Labs (all labs ordered are listed, but only abnormal results are displayed) Labs Reviewed - No data to display  EKG  EKG Interpretation None       Radiology Dg Knee Complete 4 Views Left  Result Date: 04/29/2017 CLINICAL DATA:  Left knee pain beginning this morning. Swelling. Unable to bear weight or straighten the knee. No known injury. EXAM: LEFT KNEE - COMPLETE 4+ VIEW COMPARISON:  04/19/2015 FINDINGS: Nonstandard positioning on the AP and oblique views limits evaluation of the joint space and articular surfaces. As visualized, there is no evidence of acute fracture or dislocation. No significant effusion. Focal sclerosis in the distal femoral metaphysis consistent with benign bone lesion, probably bone island. This is unchanged  since prior study. Soft tissues are unremarkable. IMPRESSION: No acute bony abnormalities. Electronically Signed   By: Burman NievesWilliam  Stevens M.D.   On: 04/29/2017 19:18    Procedures Procedures (including critical care time)  Medications Ordered in ED Medications  oxyCODONE-acetaminophen (PERCOCET/ROXICET) 5-325 MG per tablet 2 tablet (2 tablets Oral Given 04/29/17 1914)  ketorolac (TORADOL) injection 60 mg (60 mg Intramuscular Given 04/29/17 1915)     Initial Impression / Assessment and Plan / ED Course  I have reviewed the triage vital signs and the nursing notes.  Pertinent labs & imaging results that were available during my care of the patient were reviewed by me and considered in my medical decision making (see chart for details).     Patient presents with left knee pain following an increase in activity in which she does not normally engage. Doubt septic joint due to history of onset. Suspect possible ligamentous injury. Orthopedic follow-up. The patient was given instructions for home care as well as return  precautions. Patient voices understanding of these instructions, accepts the plan, and is comfortable with discharge.    Final Clinical Impressions(s) / ED Diagnoses   Final diagnoses:  Acute pain of left knee    New Prescriptions Discharge Medication List as of 04/29/2017  7:52 PM    START taking these medications   Details  ibuprofen (ADVIL,MOTRIN) 600 MG tablet Take 1 tablet (600 mg total) by mouth every 6 (six) hours as needed., Starting Thu 04/29/2017, Print    oxyCODONE-acetaminophen (PERCOCET/ROXICET) 5-325 MG tablet Take 1-2 tablets by mouth every 6 (six) hours as needed for severe pain., Starting Thu 04/29/2017, Print      I personally performed the services described in this documentation, which was scribed in my presence. The recorded information has been reviewed and is accurate.   Anselm Pancoast, PA-C 05/01/17 0148    Tilden Fossa, MD 05/02/17 [phone]

## 2017-05-21 ENCOUNTER — Encounter (HOSPITAL_BASED_OUTPATIENT_CLINIC_OR_DEPARTMENT_OTHER): Payer: Self-pay

## 2017-05-21 ENCOUNTER — Emergency Department (HOSPITAL_BASED_OUTPATIENT_CLINIC_OR_DEPARTMENT_OTHER)
Admission: EM | Admit: 2017-05-21 | Discharge: 2017-05-22 | Disposition: A | Discharge status: Home / Self Care | Payer: PRIVATE HEALTH INSURANCE | Attending: Emergency Medicine | Admitting: Emergency Medicine

## 2017-05-21 DIAGNOSIS — Y999 Unspecified external cause status: Secondary | ICD-10-CM | POA: Insufficient documentation

## 2017-05-21 DIAGNOSIS — J45909 Unspecified asthma, uncomplicated: Secondary | ICD-10-CM | POA: Diagnosis not present

## 2017-05-21 DIAGNOSIS — S46901A Unspecified injury of unspecified muscle, fascia and tendon at shoulder and upper arm level, right arm, initial encounter: Secondary | ICD-10-CM | POA: Insufficient documentation

## 2017-05-21 DIAGNOSIS — Y939 Activity, unspecified: Secondary | ICD-10-CM | POA: Insufficient documentation

## 2017-05-21 DIAGNOSIS — X58XXXA Exposure to other specified factors, initial encounter: Secondary | ICD-10-CM | POA: Insufficient documentation

## 2017-05-21 DIAGNOSIS — S59901A Unspecified injury of right elbow, initial encounter: Secondary | ICD-10-CM | POA: Diagnosis present

## 2017-05-21 DIAGNOSIS — Z87891 Personal history of nicotine dependence: Secondary | ICD-10-CM | POA: Diagnosis not present

## 2017-05-21 DIAGNOSIS — Y929 Unspecified place or not applicable: Secondary | ICD-10-CM | POA: Insufficient documentation

## 2017-05-21 DIAGNOSIS — S46909A Unspecified injury of unspecified muscle, fascia and tendon at shoulder and upper arm level, unspecified arm, initial encounter: Secondary | ICD-10-CM

## 2017-05-21 NOTE — ED Triage Notes (Signed)
Pt states she heard a pop to right upper FA while pulling on pants approx 1 hour PTA-NAD-steady gait

## 2017-05-21 NOTE — ED Provider Notes (Addendum)
[facility] Provider Note: Lowella Dell, MD, FACEP  CSN: [id number] MRN: [id number] ARRIVAL: 05/21/17 at 2253 ROOM: MH10/MH10   CHIEF COMPLAINT  Arm Injury   HISTORY OF PRESENT ILLNESS  05/21/17 11:57 PM Rebekah Glenn is a 51 y.o. female with a history of tennis elbow. She has been having pain in her right lateral epicondyle for the past 2 days. About 9:30 PM she was pulling on her jeans when she felt a pop in her right lateral epicondyle. She is now having 8 out of 10 pain in that area. The pain radiates both proximally and distally. She is having some paresthesias in the middle fingers of her right hand. Range of motion of the right elbow is limited as is supination and pronation. She has not taken anything for her pain. Pain is worse with attempted movement of the right elbow.  Consultation with the [facility] state controlled substances database reveals the patient has received one prescription for oxycodone in the past year.   Past Medical History:  Diagnosis Date  . Asthma     Past Surgical History:  Procedure Laterality Date  . HERNIA REPAIR    . LAPAROSCOPIC GASTRIC BANDING  2005  . LAPAROSCOPIC GASTRIC SLEEVE RESECTION    . LAPAROSCOPIC REPAIR AND REMOVAL OF GASTRIC BAND  09/2012  . ROTATOR CUFF REPAIR     R arm  . TUBAL LIGATION      No family history on file.  Social History  Substance Use Topics  . Smoking status: Former Games developer  . Smokeless tobacco: Never Used  . Alcohol use Yes    Prior to Admission medications   Medication Sig Start Date End Date Taking? Authorizing Provider  albuterol (PROVENTIL HFA;VENTOLIN HFA) 108 (90 Base) MCG/ACT inhaler Inhale 2 puffs into the lungs every 6 (six) hours as needed for wheezing or shortness of breath. 10/26/15   Muthersbaugh, Dahlia Client, PA-C  budesonide-formoterol (SYMBICORT) 80-4.5 MCG/ACT inhaler Inhale 2 puffs into the lungs 2 (two) times daily. 10/26/15   Muthersbaugh, Dahlia Client, PA-C  montelukast  (SINGULAIR) 10 MG tablet Take 1 tablet (10 mg total) by mouth at bedtime. 10/26/15   Muthersbaugh, Dahlia Client, PA-C  oxyCODONE-acetaminophen (PERCOCET/ROXICET) 5-325 MG tablet Take 1-2 tablets by mouth every 6 (six) hours as needed for severe pain. 04/29/17   Joy, Shawn C, PA-C    Allergies Vicodin [hydrocodone-acetaminophen]   REVIEW OF SYSTEMS  Negative except as noted here or in the History of Present Illness.   PHYSICAL EXAMINATION  Initial Vital Signs Blood pressure (!) 149/106, pulse 69, temperature 98.1 F (36.7 C), temperature source Oral, resp. rate 20, last menstrual period 12/09/2014, SpO2 100 %.  Examination General: Well-developed, well-nourished female in no acute distress; appearance consistent with age of record HENT: normocephalic; atraumatic Eyes: Normal appearance Neck: supple Heart: regular rate and rhythm Lungs: clear to auscultation bilaterally Abdomen: soft; nondistended; nontender; bowel sounds present Extremities: No deformity; full range of motion except right elbow; tenderness over right lateral epicondyles with decreased range of motion of right elbow including supination and pronation; sensation intact distally but subjectively altered; capillary refill distally is brisk with normal pulses of the right wrist; tendon function intact in right hand but range of motion limited due to pain Neurologic: Awake, alert and oriented; motor function intact in all extremities and symmetric; no facial droop Skin: Warm and dry Psychiatric: Normal mood and affect   RESULTS  Summary of this visit's results, reviewed by myself:   EKG Interpretation  Date/Time:  Ventricular Rate:    PR Interval:    QRS Duration:   QT Interval:    QTC Calculation:   R Axis:     Text Interpretation:        Laboratory Studies: No results found for this or any previous visit (from the past 24 hour(s)). Imaging Studies: Dg Elbow Complete Right  Result Date: 05/22/2017 CLINICAL  DATA:  Elbow pain with decreased range of motion EXAM: RIGHT ELBOW - COMPLETE 3+ VIEW COMPARISON:  None. FINDINGS: There is no evidence of fracture, dislocation, or joint effusion. There is no evidence of arthropathy or other focal bone abnormality. Soft tissues are unremarkable. IMPRESSION: Negative. Electronically Signed   By: Jasmine Pang M.D.   On: 05/22/2017 01:04    ED COURSE  Nursing notes and initial vitals signs, including pulse oximetry, reviewed.  Vitals:   05/21/17 2300  BP: (!) 149/106  Pulse: 69  Resp: 20  Temp: 98.1 F (36.7 C)  TempSrc: Oral  SpO2: 100%   We will splint the patient's right elbow and have her follow-up with her orthopedist in [place] next week.  PROCEDURES    ED DIAGNOSES     ICD-10-CM   1. Injury of tendon of elbow S46.909A        Paula Libra, MD 05/22/17 0109    Paula Libra, MD 05/22/17 0111

## 2017-05-22 ENCOUNTER — Emergency Department (HOSPITAL_BASED_OUTPATIENT_CLINIC_OR_DEPARTMENT_OTHER): Payer: PRIVATE HEALTH INSURANCE

## 2017-05-22 MED ORDER — OXYCODONE-ACETAMINOPHEN 5-325 MG PO TABS: 1.0000 | ORAL_TABLET | Freq: Four times a day (QID) | ORAL | 0 refills | Status: DC | PRN

## 2017-05-22 MED ORDER — NAPROXEN 375 MG PO TABS: 375.0000 mg | ORAL_TABLET | Freq: Two times a day (BID) | ORAL | 0 refills | Status: AC | PRN

## 2017-05-22 MED ORDER — NAPROXEN 250 MG PO TABS
500.0000 mg | ORAL_TABLET | Freq: Once | ORAL | Status: AC
  Administered 2017-05-22: 500 mg via ORAL
  Filled 2017-05-22: qty 2

## 2017-05-22 NOTE — ED Notes (Signed)
PMS intact before and after. Pt tolerated well. All questions answered. 

## 2018-02-09 ENCOUNTER — Encounter (HOSPITAL_BASED_OUTPATIENT_CLINIC_OR_DEPARTMENT_OTHER): Payer: Self-pay

## 2018-02-09 ENCOUNTER — Emergency Department (HOSPITAL_BASED_OUTPATIENT_CLINIC_OR_DEPARTMENT_OTHER): Payer: PRIVATE HEALTH INSURANCE

## 2018-02-09 ENCOUNTER — Emergency Department (HOSPITAL_BASED_OUTPATIENT_CLINIC_OR_DEPARTMENT_OTHER)
Admission: EM | Admit: 2018-02-09 | Discharge: 2018-02-09 | Disposition: A | Discharge status: Home / Self Care | Payer: PRIVATE HEALTH INSURANCE | Attending: Emergency Medicine | Admitting: Emergency Medicine

## 2018-02-09 ENCOUNTER — Other Ambulatory Visit: Payer: Self-pay

## 2018-02-09 DIAGNOSIS — J4521 Mild intermittent asthma with (acute) exacerbation: Secondary | ICD-10-CM | POA: Insufficient documentation

## 2018-02-09 DIAGNOSIS — Z87891 Personal history of nicotine dependence: Secondary | ICD-10-CM | POA: Insufficient documentation

## 2018-02-09 DIAGNOSIS — R05 Cough: Secondary | ICD-10-CM | POA: Diagnosis present

## 2018-02-09 DIAGNOSIS — Z79899 Other long term (current) drug therapy: Secondary | ICD-10-CM | POA: Insufficient documentation

## 2018-02-09 DIAGNOSIS — R059 Cough, unspecified: Secondary | ICD-10-CM

## 2018-02-09 LAB — RAPID STREP SCREEN (MED CTR MEBANE ONLY): STREPTOCOCCUS, GROUP A SCREEN (DIRECT): NEGATIVE

## 2018-02-09 MED ORDER — ALBUTEROL SULFATE (2.5 MG/3ML) 0.083% IN NEBU
INHALATION_SOLUTION | RESPIRATORY_TRACT | Status: AC
  Administered 2018-02-09: 2.5 mg
  Filled 2018-02-09: qty 3

## 2018-02-09 MED ORDER — FLUTICASONE PROPIONATE 50 MCG/ACT NA SUSP: 1.0000 | Freq: Every day | NASAL | 0 refills | Status: AC

## 2018-02-09 MED ORDER — PREDNISONE 20 MG PO TABS: 40.0000 mg | ORAL_TABLET | Freq: Every day | ORAL | 0 refills | Status: AC

## 2018-02-09 MED ORDER — CETIRIZINE HCL 10 MG PO CAPS: 10.0000 mg | ORAL_CAPSULE | Freq: Once | ORAL | 0 refills | Status: AC

## 2018-02-09 MED ORDER — IPRATROPIUM-ALBUTEROL 0.5-2.5 (3) MG/3ML IN SOLN
RESPIRATORY_TRACT | Status: AC
  Administered 2018-02-09: 3 mL
  Filled 2018-02-09: qty 3

## 2018-02-09 MED ORDER — PREDNISONE 50 MG PO TABS
60.0000 mg | ORAL_TABLET | Freq: Once | ORAL | Status: AC
  Administered 2018-02-09: 18:00:00 60 mg via ORAL
  Filled 2018-02-09: qty 1

## 2018-02-09 NOTE — ED Provider Notes (Signed)
[facility] EMERGENCY DEPARTMENT Provider Note   CSN: [id number] Arrival date & time: 02/09/18  1631    History   Chief Complaint Chief Complaint  Patient presents with  . Asthma    HPI Rebekah Glenn is a 52 y.o. female who presents with cough, shortness of breath, wheezing.  Past medical history significant for asthma.  She states that for the past month she has had flulike symptoms.  These have improved but then she thinks that maybe she had issues with allergies.  Over the past 24 hours she has been coughing and wheezing consistent with an asthma exacerbation.  She is been using her home inhaler without relief.  She is taking Benadryl for allergies.  She received a breathing treatment in triage which she states has helped her.  HPI  Past Medical History:  Diagnosis Date  . Asthma     There are no active problems to display for this patient.   Past Surgical History:  Procedure Laterality Date  . HERNIA REPAIR    . LAPAROSCOPIC GASTRIC BANDING  2005  . LAPAROSCOPIC GASTRIC SLEEVE RESECTION    . LAPAROSCOPIC REPAIR AND REMOVAL OF GASTRIC BAND  09/2012  . ROTATOR CUFF REPAIR     R arm  . TUBAL LIGATION       OB History   None      Home Medications    Prior to Admission medications   Medication Sig Start Date End Date Taking? Authorizing Provider  albuterol (PROVENTIL HFA;VENTOLIN HFA) 108 (90 Base) MCG/ACT inhaler Inhale 2 puffs into the lungs every 6 (six) hours as needed for wheezing or shortness of breath. 10/26/15   Muthersbaugh, Dahlia Client, PA-C  budesonide-formoterol (SYMBICORT) 80-4.5 MCG/ACT inhaler Inhale 2 puffs into the lungs 2 (two) times daily. 10/26/15   Muthersbaugh, Dahlia Client, PA-C  montelukast (SINGULAIR) 10 MG tablet Take 1 tablet (10 mg total) by mouth at bedtime. 10/26/15   Muthersbaugh, Dahlia Client, PA-C  naproxen (NAPROSYN) 375 MG tablet Take 1 tablet (375 mg total) by mouth 2 (two) times daily as needed (for elbow pain). 05/22/17   Molpus, John, MD      Family History No family history on file.  Social History Social History   Tobacco Use  . Smoking status: Former Games developer  . Smokeless tobacco: Never Used  Substance Use Topics  . Alcohol use: Not Currently    Frequency: Never  . Drug use: No     Allergies   Vicodin [hydrocodone-acetaminophen]   Review of Systems Review of Systems  Constitutional: Negative for chills and fever.  HENT: Positive for congestion and rhinorrhea.   Respiratory: Positive for cough, shortness of breath and wheezing.   Cardiovascular: Negative for chest pain.  Allergic/Immunologic: Positive for environmental allergies.  All other systems reviewed and are negative.    Physical Exam Updated Vital Signs BP (!) 143/90 (BP Location: Left Arm)   Pulse 92   Temp 98.4 F (36.9 C) (Oral)   Resp (!) 28   Ht  (1.702 m)   Wt (!) 152.3 kg (335 lb 11.2 oz)   LMP 12/09/2014   SpO2 99%   BMI 52.58 kg/m   Physical Exam  Constitutional: She is oriented to person, place, and time. She appears well-developed and well-nourished. No distress.  Calm, cooperative, obese.  HENT:  Head: Normocephalic and atraumatic.  Right Ear: Tympanic membrane normal.  Left Ear: Tympanic membrane normal.  Nose: Mucosal edema present.  Mouth/Throat: Uvula is midline and mucous membranes are normal. Posterior  oropharyngeal edema and posterior oropharyngeal erythema present. Tonsils are 2+ on the right. Tonsils are 2+ on the left.  Eyes: Pupils are equal, round, and reactive to light. Conjunctivae are normal. Right eye exhibits no discharge. Left eye exhibits no discharge. No scleral icterus.  Neck: Normal range of motion.  Cardiovascular: Normal rate and regular rhythm.  Pulmonary/Chest: Effort normal and breath sounds normal. No respiratory distress.  Abdominal: She exhibits no distension.  Neurological: She is alert and oriented to person, place, and time.  Skin: Skin is warm and dry.  Psychiatric: She has a  normal mood and affect. Her behavior is normal.  Nursing note and vitals reviewed.    ED Treatments / Results  Labs (all labs ordered are listed, but only abnormal results are displayed) Labs Reviewed - No data to display  EKG None  Radiology Dg Chest 2 View  Result Date: 02/09/2018 CLINICAL DATA:  Shortness of breath 2 days with cough and congestion 1 week. EXAM: CHEST - 2 VIEW COMPARISON:  01/17/2013 FINDINGS: Lungs are adequately inflated without focal airspace consolidation or effusion. Cardiomediastinal silhouette and remainder the exam is unchanged. IMPRESSION: No active cardiopulmonary disease. Electronically Signed   By: Elberta Fortis M.D.   On: 02/09/2018 17:25    Procedures Procedures (including critical care time)  Medications Ordered in ED Medications  albuterol (PROVENTIL) (2.5 MG/3ML) 0.083% nebulizer solution (2.5 mg  Given 02/09/18 1646)  ipratropium-albuterol (DUONEB) 0.5-2.5 (3) MG/3ML nebulizer solution (3 mLs  Given 02/09/18 1646)     Initial Impression / Assessment and Plan / ED Course  I have reviewed the triage vital signs and the nursing notes.  Pertinent labs & imaging results that were available during my care of the patient were reviewed by me and considered in my medical decision making (see chart for details).  52 year old female presents with shortness of breath and wheezing consistent with asthma exacerbation.  She received a breathing treatment which improved her symptoms.  She is mildly hypertensive but otherwise vital signs are normal.  On exam she is very swollen tonsils with possible small exudates.  Will check a strep but otherwise we will discharge her with allergy medicine, Flonase, steroids for a couple of days.  Strep is negative. Xray is negative. Will d/c with steroid, flonase, Zyrtec. Return precautions given.  Final Clinical Impressions(s) / ED Diagnoses   Final diagnoses:  Mild intermittent asthma with exacerbation  Cough    ED  Discharge Orders    None       Bethel Born, PA-C 02/09/18 1919    Pricilla Loveless, MD 02/15/18 [phone]

## 2018-02-09 NOTE — Discharge Instructions (Signed)
Take Prednisone for the next 5 days.  Start Cetirizine and Flonase for allergies You can try over the counter cough medicine Return if worsening

## 2018-02-09 NOTE — ED Triage Notes (Addendum)
C/o "asthma attack" x 24 hours-last used inhaler at 230pm-NAD-steady gait

## 2018-02-09 NOTE — ED Notes (Signed)
Called to triage to assess patient for neb. When I arrived, patient was tachy, but could speak in complete sentences. Stated she last took neb at 1430. BBS diminished, with upper airway wheezes. Not able to take a very deep breath. Given neb and seems to have called down. RR now 18. Rt to monitor as needed

## 2018-02-12 LAB — CULTURE, GROUP A STREP (THRC)

## 2018-12-28 IMAGING — CR DG KNEE COMPLETE 4+V*L*
4 series · 4 of 4 positions shown · non-contrast
Comparison: 04/19/2015

CLINICAL DATA: Left knee pain beginning this morning. Swelling.
Unable to bear weight or straighten the knee. No known injury.

EXAM:
LEFT KNEE - COMPLETE 4+ VIEW

[t knee oblique left (1 of 2)]
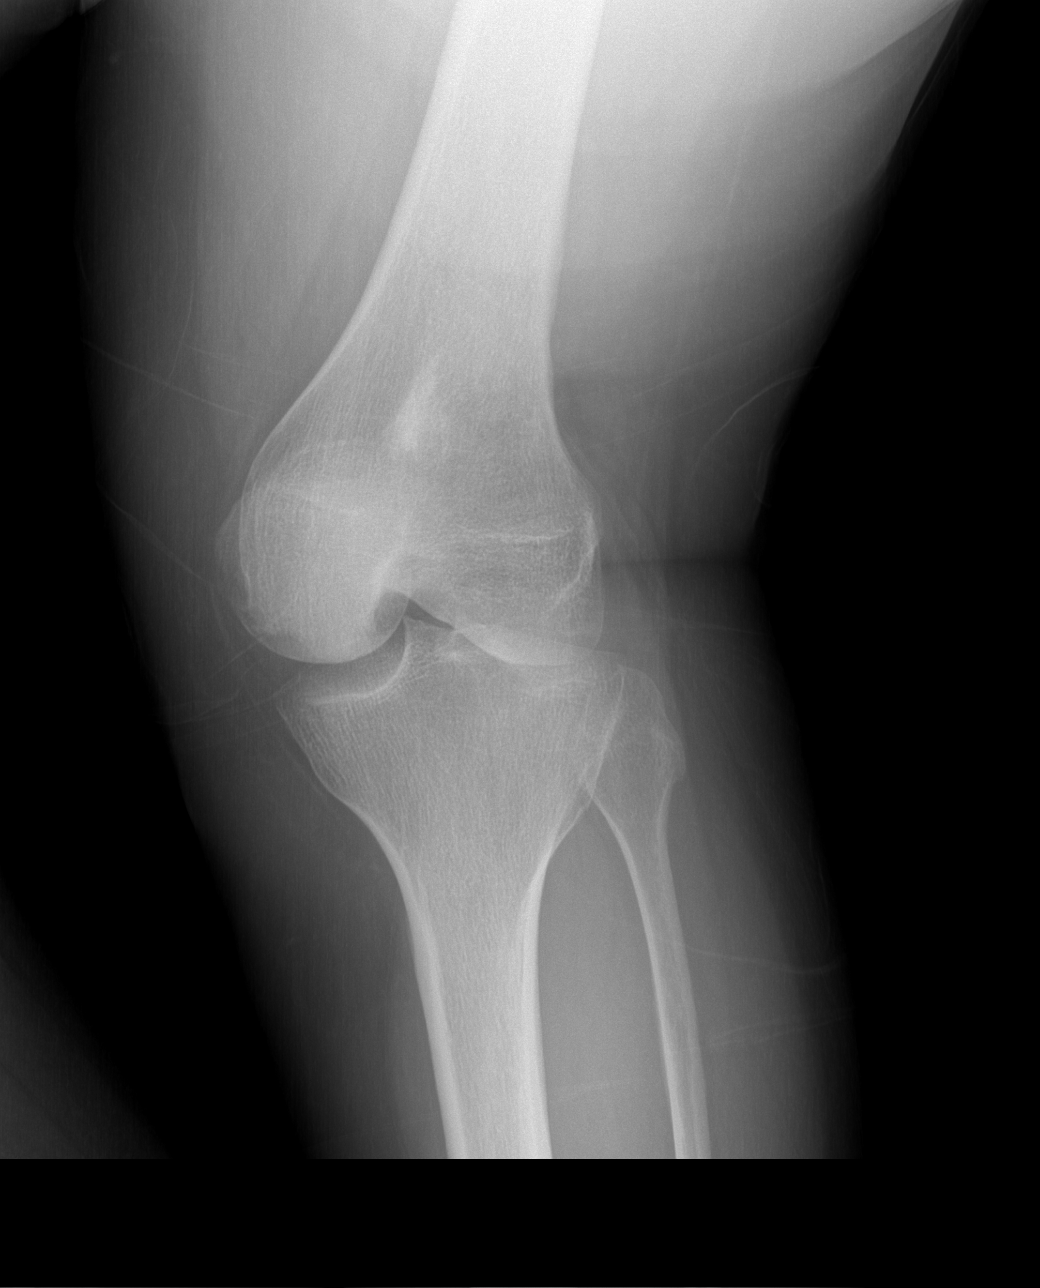

[t knee ap left]
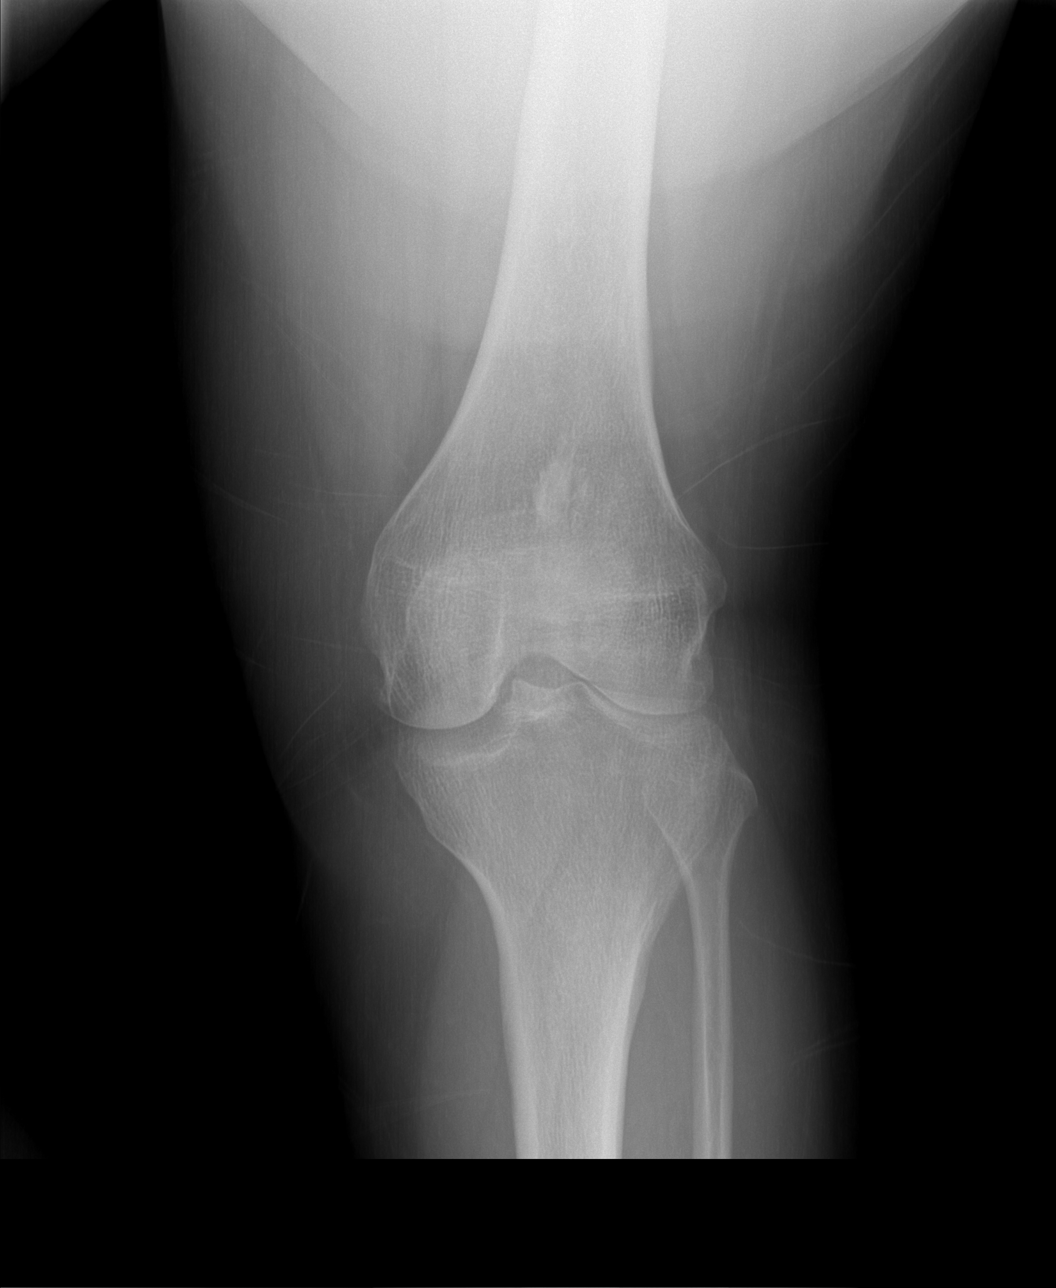

[t knee oblique left (2 of 2)]
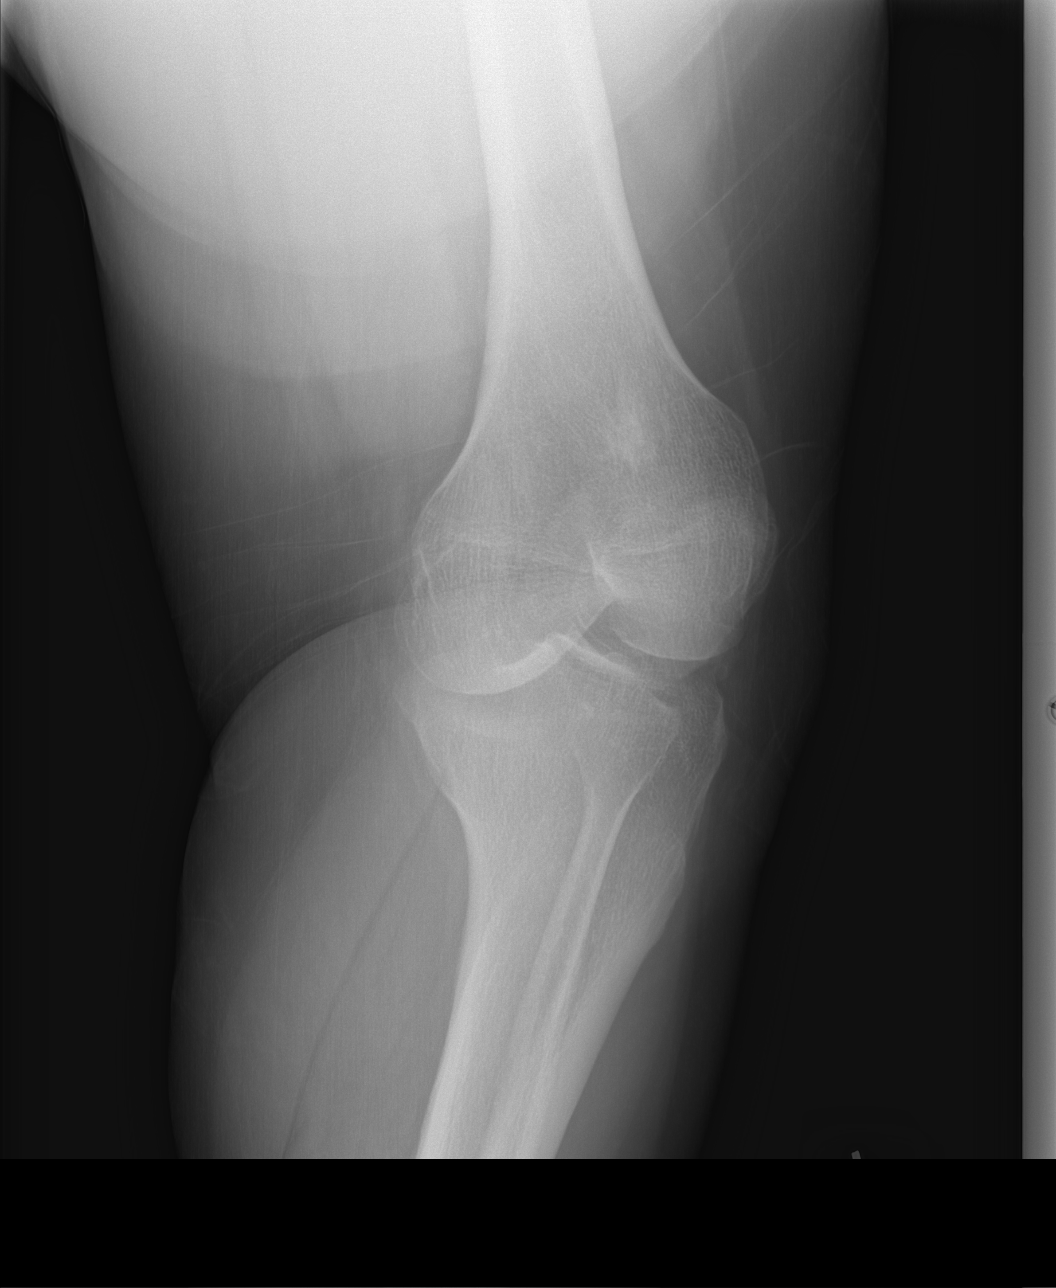

[t knee lat left]
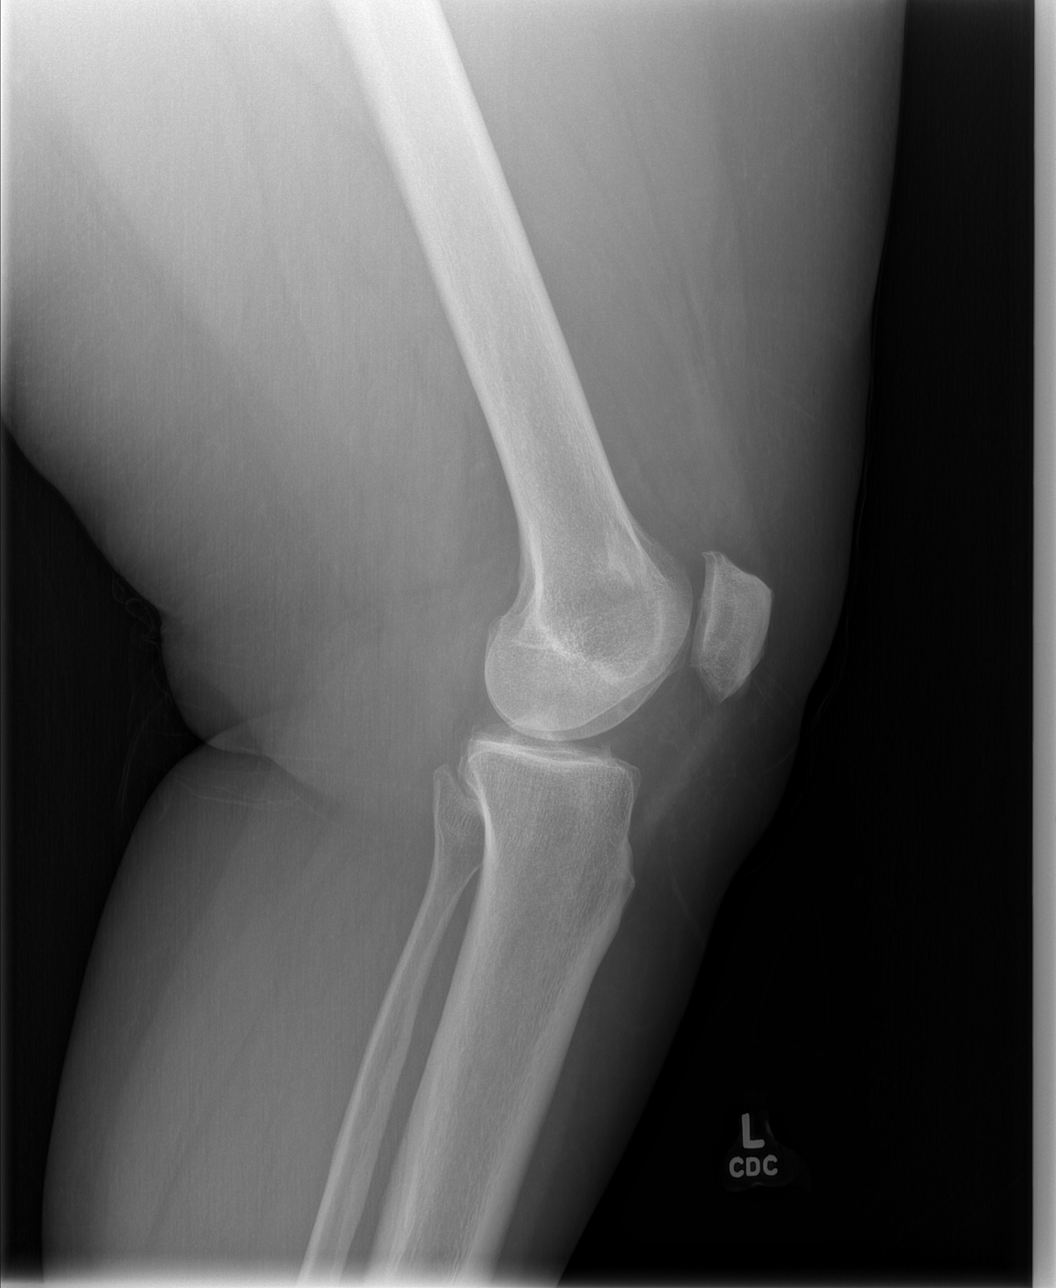

[4 of 4 positions shown; findings below may reference images not displayed]

FINDINGS: Nonstandard positioning on the AP and oblique views limits
evaluation of the joint space and articular surfaces. As visualized,
there is no evidence of acute fracture or dislocation. No
significant effusion. Focal sclerosis in the distal femoral
metaphysis consistent with benign bone lesion, probably bone island.
This is unchanged since prior study. Soft tissues are unremarkable.
IMPRESSION: No acute bony abnormalities.

## 2020-01-14 ENCOUNTER — Encounter (HOSPITAL_BASED_OUTPATIENT_CLINIC_OR_DEPARTMENT_OTHER): Payer: Self-pay | Admitting: Emergency Medicine

## 2020-01-14 ENCOUNTER — Emergency Department (HOSPITAL_BASED_OUTPATIENT_CLINIC_OR_DEPARTMENT_OTHER): Payer: PRIVATE HEALTH INSURANCE

## 2020-01-14 ENCOUNTER — Other Ambulatory Visit: Payer: Self-pay

## 2020-01-14 ENCOUNTER — Emergency Department (HOSPITAL_BASED_OUTPATIENT_CLINIC_OR_DEPARTMENT_OTHER)
Admission: EM | Admit: 2020-01-14 | Discharge: 2020-01-14 | Disposition: A | Discharge status: Home / Self Care | Payer: PRIVATE HEALTH INSURANCE | Attending: Emergency Medicine | Admitting: Emergency Medicine

## 2020-01-14 DIAGNOSIS — R0789 Other chest pain: Secondary | ICD-10-CM | POA: Insufficient documentation

## 2020-01-14 DIAGNOSIS — Z79899 Other long term (current) drug therapy: Secondary | ICD-10-CM | POA: Diagnosis not present

## 2020-01-14 DIAGNOSIS — R202 Paresthesia of skin: Secondary | ICD-10-CM | POA: Diagnosis not present

## 2020-01-14 DIAGNOSIS — J45909 Unspecified asthma, uncomplicated: Secondary | ICD-10-CM | POA: Insufficient documentation

## 2020-01-14 DIAGNOSIS — R0602 Shortness of breath: Secondary | ICD-10-CM | POA: Insufficient documentation

## 2020-01-14 DIAGNOSIS — Z885 Allergy status to narcotic agent status: Secondary | ICD-10-CM | POA: Diagnosis not present

## 2020-01-14 LAB — HEPATIC FUNCTION PANEL
ALT: 16 U/L (ref 0–44)
AST: 20 U/L (ref 15–41)
Albumin: 3.5 g/dL (ref 3.5–5.0)
Alkaline Phosphatase: 83 U/L (ref 38–126)
Bilirubin, Direct: 0.1 mg/dL (ref 0.0–0.2)
Indirect Bilirubin: 0.2 mg/dL — ABNORMAL LOW (ref 0.3–0.9)
Total Bilirubin: 0.3 mg/dL (ref 0.3–1.2)
Total Protein: 7.4 g/dL (ref 6.5–8.1)

## 2020-01-14 LAB — CBC
HCT: 41 % (ref 36.0–46.0)
Hemoglobin: 14.3 g/dL (ref 12.0–15.0)
MCH: 28.7 pg (ref 26.0–34.0)
MCHC: 34.9 g/dL (ref 30.0–36.0)
MCV: 82.2 fL (ref 80.0–100.0)
Platelets: 321 10*3/uL (ref 150–400)
RBC: 4.99 MIL/uL (ref 3.87–5.11)
RDW: 13.2 % (ref 11.5–15.5)
WBC: 8.4 10*3/uL (ref 4.0–10.5)
nRBC: 0 % (ref 0.0–0.2)

## 2020-01-14 LAB — TROPONIN I (HIGH SENSITIVITY)
Troponin I (High Sensitivity): 4 ng/L (ref <18)
Troponin I (High Sensitivity): 4 ng/L (ref <18)

## 2020-01-14 LAB — BASIC METABOLIC PANEL
Anion gap: 6 (ref 5–15)
BUN: 10 mg/dL (ref 6–20)
CO2: 25 mmol/L (ref 22–32)
Calcium: 8.5 mg/dL — ABNORMAL LOW (ref 8.9–10.3)
Chloride: 105 mmol/L (ref 98–111)
Creatinine, Ser: 0.75 mg/dL (ref 0.44–1.00)
GFR calc Af Amer: >60 mL/min (ref >60)
GFR calc non Af Amer: >60 mL/min (ref >60)
Glucose, Bld: 116 mg/dL — ABNORMAL HIGH (ref 70–99)
Potassium: 3.7 mmol/L (ref 3.5–5.1)
Sodium: 136 mmol/L (ref 135–145)

## 2020-01-14 LAB — LIPASE, BLOOD: Lipase: 21 U/L (ref 11–51)

## 2020-01-14 MED ORDER — ALUM & MAG HYDROXIDE-SIMETH 200-200-20 MG/5ML PO SUSP
30.0000 mL | Freq: Once | ORAL | Status: AC
  Administered 2020-01-14: 09:00:00 30 mL via ORAL
  Filled 2020-01-14: qty 30

## 2020-01-14 MED ORDER — ONDANSETRON HCL 4 MG/2ML IJ SOLN
4.0000 mg | Freq: Once | INTRAMUSCULAR | Status: AC
  Administered 2020-01-14: 4 mg via INTRAVENOUS
  Filled 2020-01-14: qty 2

## 2020-01-14 MED ORDER — MORPHINE SULFATE (PF) 2 MG/ML IV SOLN
2.0000 mg | Freq: Once | INTRAVENOUS | Status: AC
  Administered 2020-01-14: 2 mg via INTRAVENOUS
  Filled 2020-01-14: qty 1

## 2020-01-14 MED ORDER — KETOROLAC TROMETHAMINE 15 MG/ML IJ SOLN
15.0000 mg | Freq: Once | INTRAMUSCULAR | Status: AC
  Administered 2020-01-14: 13:00:00 15 mg via INTRAVENOUS
  Filled 2020-01-14: qty 1

## 2020-01-14 NOTE — ED Provider Notes (Signed)
[facility] EMERGENCY DEPARTMENT Provider Note   CSN: [id number] Arrival date & time: 01/14/20  8786     History Chief Complaint  Patient presents with  . Chest Pain    Rebekah Glenn is a 54 y.o. female.  54 yo F with a chief complaint of chest pain.  This started about 15 minutes ago.  Was sudden and severe radiated into her left arm.  Feels like someone is sitting on her chest.  Had some shortness of breath with it felt she got a little sweaty.  Started while she was having a bowel movement.  Stated that she had a loose stool.  Denied any symptoms earlier this morning denies any symptoms yesterday.  Nothing seems to make this better or worse.  Denies cough congestion or fever denies nausea or vomiting.  She feels some tingling in her hands and feet worse on the left side than the right.  She denies trauma.  She denies history of MI, denies hypertension hyperlipidemia diabetes or smoking.  Denies family history of MI.  Patient denies history of PE or DVT denies hemoptysis denies unilateral lower extremity edema denies recent surgery immobilization hospitalization estrogen use or history of cancer.  The history is provided by the patient.  Chest Pain Pain location:  L chest Pain quality: pressure   Pain radiates to:  L shoulder Pain severity:  Moderate Onset quality:  Gradual Duration:  10 minutes Timing:  Constant Progression:  Unchanged Chronicity:  New Relieved by:  Nothing Worsened by:  Nothing Ineffective treatments:  None tried Associated symptoms: diaphoresis and shortness of breath   Associated symptoms: no abdominal pain, no dizziness, no fever, no headache, no nausea, no palpitations and no vomiting        Past Medical History:  Diagnosis Date  . Asthma     There are no problems to display for this patient.   Past Surgical History:  Procedure Laterality Date  . HERNIA REPAIR    . LAPAROSCOPIC GASTRIC BANDING  2005  . LAPAROSCOPIC GASTRIC  SLEEVE RESECTION    . LAPAROSCOPIC REPAIR AND REMOVAL OF GASTRIC BAND  09/2012  . ROTATOR CUFF REPAIR     R arm  . TUBAL LIGATION       OB History   No obstetric history on file.     No family history on file.  Social History   Tobacco Use  . Smoking status: Former Games developer  . Smokeless tobacco: Never Used  Substance Use Topics  . Alcohol use: Not Currently  . Drug use: No    Home Medications Prior to Admission medications   Medication Sig Start Date End Date Taking? Authorizing Provider  albuterol (PROVENTIL HFA;VENTOLIN HFA) 108 (90 Base) MCG/ACT inhaler Inhale 2 puffs into the lungs every 6 (six) hours as needed for wheezing or shortness of breath. 10/26/15   Muthersbaugh, Dahlia Client, PA-C  budesonide-formoterol (SYMBICORT) 80-4.5 MCG/ACT inhaler Inhale 2 puffs into the lungs 2 (two) times daily. 10/26/15   Muthersbaugh, Dahlia Client, PA-C  Cetirizine HCl 10 MG CAPS Take 1 capsule (10 mg total) by mouth once for 1 dose. 02/09/18 02/09/18  Bethel Born, PA-C  fluticasone (FLONASE) 50 MCG/ACT nasal spray Place 1 spray into both nostrils daily. 02/09/18   Bethel Born, PA-C  montelukast (SINGULAIR) 10 MG tablet Take 1 tablet (10 mg total) by mouth at bedtime. 10/26/15   Muthersbaugh, Dahlia Client, PA-C  naproxen (NAPROSYN) 375 MG tablet Take 1 tablet (375 mg total) by mouth 2 (two) times  daily as needed (for elbow pain). 05/22/17   Molpus, John, MD  predniSONE (DELTASONE) 20 MG tablet Take 2 tablets (40 mg total) by mouth daily. 02/09/18   Bethel Born, PA-C    Allergies    Vicodin [hydrocodone-acetaminophen]  Review of Systems   Review of Systems  Constitutional: Positive for diaphoresis. Negative for chills and fever.  HENT: Negative for congestion and rhinorrhea.   Eyes: Negative for redness and visual disturbance.  Respiratory: Positive for shortness of breath. Negative for wheezing.   Cardiovascular: Positive for chest pain. Negative for palpitations.  Gastrointestinal:  Positive for diarrhea. Negative for abdominal pain, nausea and vomiting.  Genitourinary: Negative for dysuria and urgency.  Musculoskeletal: Negative for arthralgias and myalgias.  Skin: Negative for pallor and wound.  Neurological: Negative for dizziness and headaches.    Physical Exam Updated Vital Signs BP (!) 151/94 (BP Location: Right Arm)   Pulse 62   Temp 98 F (36.7 C) (Oral)   Resp 18   Ht 5\' 7"  (1.702 m)   Wt (!) 170.1 kg   LMP 12/09/2014   SpO2 99%   BMI 58.73 kg/m   Physical Exam Vitals and nursing note reviewed.  Constitutional:      General: She is not in acute distress.    Appearance: She is well-developed. She is morbidly obese. She is not diaphoretic.  HENT:     Head: Normocephalic and atraumatic.  Eyes:     Pupils: Pupils are equal, round, and reactive to light.  Cardiovascular:     Rate and Rhythm: Normal rate and regular rhythm.     Heart sounds: No murmur. No friction rub. No gallop.   Pulmonary:     Effort: Pulmonary effort is normal.     Breath sounds: No wheezing or rales.  Chest:     Chest wall: Tenderness present.     Comments: Palpation of the left chest wall about the parasternal region ribs 2 through 4 reproduces the patient's symptoms Abdominal:     General: There is no distension.     Palpations: Abdomen is soft.     Tenderness: There is abdominal tenderness (epigastric).  Musculoskeletal:        General: No tenderness.     Cervical back: Normal range of motion and neck supple.     Comments: Tenderness to the left trapezius muscle belly.  Pulse motor and sensation are intact to all 4 extremities.  Skin:    General: Skin is warm and dry.  Neurological:     Mental Status: She is alert and oriented to person, place, and time.  Psychiatric:        Behavior: Behavior normal.     ED Results / Procedures / Treatments   Labs (all labs ordered are listed, but only abnormal results are displayed) Labs Reviewed  BASIC METABOLIC PANEL -  Abnormal; Notable for the following components:      Result Value   Glucose, Bld 116 (*)    Calcium 8.5 (*)    All other components within normal limits  HEPATIC FUNCTION PANEL - Abnormal; Notable for the following components:   Indirect Bilirubin 0.2 (*)    All other components within normal limits  CBC  LIPASE, BLOOD  TROPONIN I (HIGH SENSITIVITY)  TROPONIN I (HIGH SENSITIVITY)    EKG EKG Interpretation  Date/Time:  Sunday January 14 2020 08:42:24 EDT Ventricular Rate:  86 PR Interval:    QRS Duration: 94 QT Interval:  384 QTC Calculation: 460 R  Axis:   63 Text Interpretation: Sinus rhythm Consider left atrial enlargement Baseline wander in lead(s) II III aVR aVF V1 V2 flipped t wave in lead III now resolved Otherwise no significant change Confirmed by Deno Etienne [phone]) on 01/14/2020 8:44:53 AM   Radiology DG Chest 2 View  Result Date: 01/14/2020 CLINICAL DATA:  Centralized chest pain radiating to left shoulder and breast. EXAM: CHEST - 2 VIEW COMPARISON:  02/09/2018 FINDINGS: Lungs are adequately inflated without focal airspace consolidation or effusion. Cardiomediastinal silhouette and remainder of the exam is unchanged. IMPRESSION: No acute cardiopulmonary disease. Electronically Signed   By: Marin Olp M.D.   On: 01/14/2020 10:00    Procedures Procedures (including critical care time)  Medications Ordered in ED Medications  morphine 2 MG/ML injection 2 mg (2 mg Intravenous Given 01/14/20 0900)  ondansetron (ZOFRAN) injection 4 mg (4 mg Intravenous Given 01/14/20 0859)  alum & mag hydroxide-simeth (MAALOX/MYLANTA) 200-200-20 MG/5ML suspension 30 mL (30 mLs Oral Given 01/14/20 0859)  morphine 2 MG/ML injection 2 mg (2 mg Intravenous Given 01/14/20 1134)  ketorolac (TORADOL) 15 MG/ML injection 15 mg (15 mg Intravenous Given 01/14/20 1234)    ED Course  I have reviewed the triage vital signs and the nursing notes.  Pertinent labs & imaging results that were available  during my care of the patient were reviewed by me and considered in my medical decision making (see chart for details).    MDM Rules/Calculators/A&P                      54 yo F with a chief complaint of chest pain.  This was sudden in onset and only going on for the past 10 minutes or so.  Completely atypical of ACS and reproduced with palpation.  Most likely this is musculoskeletal.  Will obtain a delta troponin.  I feel this is completely atypical of pulmonary embolism and no further work-up is needed.  I considered dissection with extremity paresthesias though seems unlikely based on history and physical.  Patient still is troponin is negative.  Will discharge the patient home.  PCP follow-up.  2:02 PM:  I have discussed the diagnosis/risks/treatment options with the patient and believe the pt to be eligible for discharge home to follow-up with PCP. We also discussed returning to the ED immediately if new or worsening sx occur. We discussed the sx which are most concerning (e.g., sudden worsening pain, fever, inability to tolerate by mouth) that necessitate immediate return. Medications administered to the patient during their visit and any new prescriptions provided to the patient are listed below.  Medications given during this visit Medications  morphine 2 MG/ML injection 2 mg (2 mg Intravenous Given 01/14/20 0900)  ondansetron (ZOFRAN) injection 4 mg (4 mg Intravenous Given 01/14/20 0859)  alum & mag hydroxide-simeth (MAALOX/MYLANTA) 200-200-20 MG/5ML suspension 30 mL (30 mLs Oral Given 01/14/20 0859)  morphine 2 MG/ML injection 2 mg (2 mg Intravenous Given 01/14/20 1134)  ketorolac (TORADOL) 15 MG/ML injection 15 mg (15 mg Intravenous Given 01/14/20 1234)     The patient appears reasonably screen and/or stabilized for discharge and I doubt any other medical condition or other [facility] requiring further screening, evaluation, or treatment in the ED at this time prior to discharge.   Final  Clinical Impression(s) / ED Diagnoses Final diagnoses:  Atypical chest pain    Rx / DC Orders ED Discharge Orders    None       Deno Etienne, DO  01/14/20 1402  

## 2020-01-14 NOTE — Discharge Instructions (Signed)
Take 4 over the counter ibuprofen tablets 3 times a day or 2 over-the-counter naproxen tablets twice a day for pain. Also take tylenol 1000mg(2 extra strength) four times a day.    

## 2020-01-14 NOTE — ED Triage Notes (Signed)
Centralized chest pain radiating into L shoulder and under L breast x 10 min. Also c/o L leg and foot numbness.

## 2020-04-18 ENCOUNTER — Emergency Department (HOSPITAL_COMMUNITY): Payer: No Typology Code available for payment source

## 2020-04-18 ENCOUNTER — Emergency Department (HOSPITAL_COMMUNITY)
Admission: EM | Admit: 2020-04-18 | Discharge: 2020-04-19 | Disposition: A | Discharge status: Home / Self Care | Payer: No Typology Code available for payment source | Attending: Emergency Medicine | Admitting: Emergency Medicine

## 2020-04-18 ENCOUNTER — Encounter (HOSPITAL_COMMUNITY): Payer: Self-pay

## 2020-04-18 DIAGNOSIS — R1030 Lower abdominal pain, unspecified: Secondary | ICD-10-CM | POA: Insufficient documentation

## 2020-04-18 DIAGNOSIS — Z87891 Personal history of nicotine dependence: Secondary | ICD-10-CM | POA: Insufficient documentation

## 2020-04-18 DIAGNOSIS — M25531 Pain in right wrist: Secondary | ICD-10-CM | POA: Insufficient documentation

## 2020-04-18 DIAGNOSIS — J45909 Unspecified asthma, uncomplicated: Secondary | ICD-10-CM | POA: Diagnosis not present

## 2020-04-18 DIAGNOSIS — R079 Chest pain, unspecified: Secondary | ICD-10-CM | POA: Insufficient documentation

## 2020-04-18 DIAGNOSIS — M25562 Pain in left knee: Secondary | ICD-10-CM | POA: Diagnosis not present

## 2020-04-18 DIAGNOSIS — Y92410 Unspecified street and highway as the place of occurrence of the external cause: Secondary | ICD-10-CM | POA: Diagnosis not present

## 2020-04-18 DIAGNOSIS — Y998 Other external cause status: Secondary | ICD-10-CM | POA: Diagnosis not present

## 2020-04-18 DIAGNOSIS — Y9389 Activity, other specified: Secondary | ICD-10-CM | POA: Insufficient documentation

## 2020-04-18 LAB — CBC
HCT: 38.7 % (ref 36.0–46.0)
Hemoglobin: 13.5 g/dL (ref 12.0–15.0)
MCH: 28.7 pg (ref 26.0–34.0)
MCHC: 34.9 g/dL (ref 30.0–36.0)
MCV: 82.3 fL (ref 80.0–100.0)
Platelets: 339 10*3/uL (ref 150–400)
RBC: 4.7 MIL/uL (ref 3.87–5.11)
RDW: 13.6 % (ref 11.5–15.5)
WBC: 10.8 10*3/uL — ABNORMAL HIGH (ref 4.0–10.5)
nRBC: 0 % (ref 0.0–0.2)

## 2020-04-18 LAB — BASIC METABOLIC PANEL
Anion gap: 7 (ref 5–15)
BUN: 7 mg/dL (ref 6–20)
CO2: 27 mmol/L (ref 22–32)
Calcium: 9.3 mg/dL (ref 8.9–10.3)
Chloride: 104 mmol/L (ref 98–111)
Creatinine, Ser: 1 mg/dL (ref 0.44–1.00)
GFR calc Af Amer: >60 mL/min (ref >60)
GFR calc non Af Amer: >60 mL/min (ref >60)
Glucose, Bld: 124 mg/dL — ABNORMAL HIGH (ref 70–99)
Potassium: 3.6 mmol/L (ref 3.5–5.1)
Sodium: 138 mmol/L (ref 135–145)
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
## 2020-04-19 ENCOUNTER — Other Ambulatory Visit: Payer: Self-pay

## 2020-04-19 ENCOUNTER — Emergency Department (HOSPITAL_COMMUNITY): Payer: No Typology Code available for payment source

## 2020-04-19 MED ORDER — KETOROLAC TROMETHAMINE 30 MG/ML IJ SOLN
30.0000 mg | Freq: Once | INTRAMUSCULAR | Status: AC
  Administered 2020-04-19: 30 mg via INTRAVENOUS
  Filled 2020-04-19: qty 1

## 2020-04-19 MED ORDER — IBUPROFEN 400 MG PO TABS
400.0000 mg | ORAL_TABLET | Freq: Four times a day (QID) | ORAL | 0 refills | Status: AC | PRN
Start: 2020-04-19 — End: ?

## 2020-04-19 MED ORDER — OXYCODONE-ACETAMINOPHEN 5-325 MG PO TABS
2.0000 | ORAL_TABLET | Freq: Once | ORAL | Status: AC
  Administered 2020-04-19: 2 via ORAL
  Filled 2020-04-19: qty 2

## 2020-04-19 MED ORDER — CYCLOBENZAPRINE HCL 10 MG PO TABS: 10.0000 mg | ORAL_TABLET | Freq: Two times a day (BID) | ORAL | 0 refills | Status: AC | PRN

## 2020-04-19 MED ORDER — FENTANYL CITRATE (PF) 100 MCG/2ML IJ SOLN
50.0000 ug | Freq: Once | INTRAMUSCULAR | Status: AC
  Administered 2020-04-19: 50 ug via INTRAVENOUS
  Filled 2020-04-19: qty 2
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
## 2020-04-19 NOTE — ED Notes (Signed)
Discharge instructions discussed with pt. Pt verbalized understanding with no questions at this time. Provided crutches. Pt ambulatory at  Discharge. Cousin at bedside. Son to pick up

## 2020-04-19 NOTE — ED Notes (Signed)
Pt transported to CT ?

## 2023-03-26 ENCOUNTER — Emergency Department (HOSPITAL_BASED_OUTPATIENT_CLINIC_OR_DEPARTMENT_OTHER): Payer: Medicare Other

## 2023-03-26 ENCOUNTER — Emergency Department (HOSPITAL_BASED_OUTPATIENT_CLINIC_OR_DEPARTMENT_OTHER)
Admission: EM | Admit: 2023-03-26 | Discharge: 2023-03-27 | Disposition: A | Discharge status: Home / Self Care | Payer: Medicare Other | Attending: Emergency Medicine | Admitting: Emergency Medicine

## 2023-03-26 ENCOUNTER — Other Ambulatory Visit: Payer: Self-pay

## 2023-03-26 ENCOUNTER — Encounter (HOSPITAL_BASED_OUTPATIENT_CLINIC_OR_DEPARTMENT_OTHER): Payer: Self-pay | Admitting: Emergency Medicine

## 2023-03-26 DIAGNOSIS — M25562 Pain in left knee: Secondary | ICD-10-CM

## 2023-03-26 DIAGNOSIS — M1990 Unspecified osteoarthritis, unspecified site: Secondary | ICD-10-CM | POA: Diagnosis not present

## 2023-03-26 MED ORDER — MELOXICAM 15 MG PO TABS: 15.0000 mg | ORAL_TABLET | Freq: Every day | ORAL | 2 refills | Status: DC

## 2023-03-26 MED ORDER — MELOXICAM 15 MG PO TABS: 15.0000 mg | ORAL_TABLET | Freq: Every day | ORAL | 2 refills | Status: AC

## 2023-03-26 MED ORDER — TRAMADOL HCL 50 MG PO TABS: 50.0000 mg | ORAL_TABLET | Freq: Four times a day (QID) | ORAL | 0 refills | Status: AC | PRN

## 2023-03-26 MED ORDER — TRAMADOL HCL 50 MG PO TABS: 50.0000 mg | ORAL_TABLET | Freq: Four times a day (QID) | ORAL | 0 refills | Status: DC | PRN

## 2023-03-26 NOTE — ED Triage Notes (Addendum)
Pt here for L posterior knee pain that radiates up leg into hip and down leg into foot. Pt reports numbness as well, states this has been going on for approx 2 weeks but got worse this week.Pt denies any falls/injuries. Pt reports she is unable to fully extend L leg, pain worse w/ ambulation and sitting/standing.

## 2023-03-27 NOTE — ED Provider Notes (Signed)
[place] EMERGENCY DEPARTMENT AT [facility] Provider Note   CSN: [id number] Arrival date & time: 03/26/23  2010     History  Chief Complaint  Patient presents with   Knee Pain    Rebekah Glenn is a 57 y.o. female.  Patient complains of pain in her knee.  Patient reports that she has pain in both knees however she is now having increasing pain in her left knee and difficulty walking.  Patient reports that she has been told in the past that she had no cartilage in her knee joint.  Patient reports she has not had any new injuries she denies any redness or swelling.  Patient reports increasing difficulty ambulating  The history is provided by the patient. No language interpreter was used.  Knee Pain Location:  Knee Injury: no   Knee location:  L knee Pain details:    Quality:  Aching   Radiates to:  Does not radiate   Severity:  Severe   Onset quality:  Gradual   Timing:  Constant   Progression:  Worsening Chronicity:  New Relieved by:  Nothing Worsened by:  Nothing Ineffective treatments:  None tried      Home Medications Prior to Admission medications   Medication Sig Start Date End Date Taking? Authorizing Provider  albuterol (PROVENTIL HFA;VENTOLIN HFA) 108 (90 Base) MCG/ACT inhaler Inhale 2 puffs into the lungs every 6 (six) hours as needed for wheezing or shortness of breath. 10/26/15   Muthersbaugh, Dahlia Client, PA-C  budesonide-formoterol (SYMBICORT) 80-4.5 MCG/ACT inhaler Inhale 2 puffs into the lungs 2 (two) times daily. 10/26/15   Muthersbaugh, Dahlia Client, PA-C  Cetirizine HCl 10 MG CAPS Take 1 capsule (10 mg total) by mouth once for 1 dose. 02/09/18 02/09/18  Bethel Born, PA-C  cyclobenzaprine (FLEXERIL) 10 MG tablet Take 1 tablet (10 mg total) by mouth 2 (two) times daily as needed for muscle spasms. 04/19/20   Mesner, Barbara Cower, MD  fluticasone (FLONASE) 50 MCG/ACT nasal spray Place 1 spray into both nostrils daily. 02/09/18   Bethel Born, PA-C   ibuprofen (ADVIL) 400 MG tablet Take 1 tablet (400 mg total) by mouth every 6 (six) hours as needed. 04/19/20   Mesner, Barbara Cower, MD  meloxicam (MOBIC) 15 MG tablet Take 1 tablet (15 mg total) by mouth daily. 03/26/23 03/25/24  Elson Areas, PA-C  montelukast (SINGULAIR) 10 MG tablet Take 1 tablet (10 mg total) by mouth at bedtime. 10/26/15   Muthersbaugh, Dahlia Client, PA-C  naproxen (NAPROSYN) 375 MG tablet Take 1 tablet (375 mg total) by mouth 2 (two) times daily as needed (for elbow pain). 05/22/17   Molpus, John, MD  predniSONE (DELTASONE) 20 MG tablet Take 2 tablets (40 mg total) by mouth daily. 02/09/18   Bethel Born, PA-C  traMADol (ULTRAM) 50 MG tablet Take 1 tablet (50 mg total) by mouth every 6 (six) hours as needed. 03/26/23 03/25/24  Elson Areas, PA-C      Allergies    Vicodin [hydrocodone-acetaminophen]    Review of Systems   Review of Systems  All other systems reviewed and are negative.   Physical Exam Updated Vital Signs BP (!) 140/91   Pulse 76   Temp 97.8 F (36.6 C) (Oral)   Resp 18   LMP 12/09/2014   SpO2 96%  Physical Exam Vitals and nursing note reviewed.  Constitutional:      Appearance: She is well-developed.  HENT:     Head: Normocephalic.  Cardiovascular:  Rate and Rhythm: Normal rate.  Pulmonary:     Effort: Pulmonary effort is normal.  Abdominal:     General: Abdomen is flat. There is no distension.  Musculoskeletal:     Cervical back: Normal range of motion.     Comments: Patient walks with a limp, patient has swelling to left knee, full range of motion neurovascular neurosensory are intact no evidence of infection Homans is negative.  Skin:    General: Skin is warm.  Neurological:     General: No focal deficit present.     Mental Status: She is alert and oriented to person, place, and time.  Psychiatric:        Mood and Affect: Mood normal.     ED Results / Procedures / Treatments   Labs (all labs ordered are listed, but only  abnormal results are displayed) Labs Reviewed - No data to display  EKG None  Radiology DG Knee Complete 4 Views Left  Result Date: 03/26/2023 CLINICAL DATA:  Left posterior knee pain, numbness EXAM: LEFT KNEE - COMPLETE 4+ VIEW COMPARISON:  04/18/2020 FINDINGS: Frontal, bilateral oblique, lateral views of the left knee are obtained. Stable mild 3 compartmental osteoarthritis. No fracture, subluxation, or dislocation. No joint effusion. Soft tissues are unremarkable. IMPRESSION: 1. Stable mild 3 compartmental osteoarthritis. 2. No acute fracture. Electronically Signed   By: Sharlet Salina M.D.   On: 03/26/2023 20:53    Procedures Procedures    Medications Ordered in ED Medications - No data to display  ED Course/ Medical Decision Making/ A&P                             Medical Decision Making Patient complains of pain in her knee.  Patient reports pain with movement.  Amount and/or Complexity of Data Reviewed Radiology: ordered and independent interpretation performed. Decision-making details documented in ED Course.    Details: X-ray left knee shows degenerative changes.  Risk Prescription drug management. Risk Details: I discussed with the patient the need to follow-up with orthopedist she is given a prescription for tramadol and meloxicam.  Patient is advised to try ice and heat.           Final Clinical Impression(s) / ED Diagnoses Final diagnoses:  Acute pain of left knee  Osteoarthritis, unspecified osteoarthritis type, unspecified site    Rx / DC Orders ED Discharge Orders          Ordered    meloxicam (MOBIC) 15 MG tablet  Daily,   Status:  Discontinued        03/26/23 2349    traMADol (ULTRAM) 50 MG tablet  Every 6 hours PRN,   Status:  Discontinued        03/26/23 2349    meloxicam (MOBIC) 15 MG tablet  Daily        03/26/23 2357    traMADol (ULTRAM) 50 MG tablet  Every 6 hours PRN        03/26/23 2357           An After Visit Summary was  printed and given to the patient.    Elson Areas, PA-C 03/27/23 2309    Edwin Dada P, DO 03/29/23 1410

## 2023-03-27 NOTE — ED Notes (Signed)
Pt A&OX4 ambulatory at d/c with independent steady gait. Pt verbalized understanding of d/c instructions, prescriptions and follow up care.
